# Patient Record
Sex: Male | Born: 1965 | Race: White | Hispanic: No | Marital: Married | Smoking: Current every day smoker
Health system: Southern US, Community
[De-identification: ages and names within clinical notes are randomized; demographics above are authoritative.]

## PROBLEM LIST (undated history)

## (undated) DIAGNOSIS — Z720 Tobacco use: Secondary | ICD-10-CM

## (undated) DIAGNOSIS — I10 Essential (primary) hypertension: Secondary | ICD-10-CM

## (undated) DIAGNOSIS — E785 Hyperlipidemia, unspecified: Secondary | ICD-10-CM

## (undated) HISTORY — DX: Tobacco use: Z72.0

## (undated) HISTORY — DX: Hyperlipidemia, unspecified: E78.5

---

## 2001-06-03 ENCOUNTER — Encounter: Payer: Self-pay | Admitting: Family Medicine

## 2001-06-03 ENCOUNTER — Encounter: Admission: RE | Admit: 2001-06-03 | Discharge: 2001-06-03 | Payer: Self-pay | Admitting: Family Medicine

## 2003-12-28 ENCOUNTER — Emergency Department (HOSPITAL_COMMUNITY): Admission: EM | Admit: 2003-12-28 | Discharge: 2003-12-28 | Payer: Self-pay | Admitting: *Deleted

## 2004-01-06 ENCOUNTER — Encounter: Admission: RE | Admit: 2004-01-06 | Discharge: 2004-01-06 | Payer: Self-pay | Admitting: Orthopedic Surgery

## 2004-10-11 ENCOUNTER — Ambulatory Visit: Payer: Self-pay | Admitting: Endocrinology

## 2004-10-19 ENCOUNTER — Ambulatory Visit: Payer: Self-pay

## 2006-01-07 ENCOUNTER — Ambulatory Visit: Payer: Self-pay | Admitting: Endocrinology

## 2006-01-15 ENCOUNTER — Ambulatory Visit: Payer: Self-pay | Admitting: Endocrinology

## 2006-01-21 ENCOUNTER — Ambulatory Visit (HOSPITAL_COMMUNITY): Admission: RE | Admit: 2006-01-21 | Discharge: 2006-01-21 | Payer: Self-pay | Admitting: Endocrinology

## 2007-05-15 ENCOUNTER — Encounter: Payer: Self-pay | Admitting: *Deleted

## 2007-10-14 IMAGING — US US SOFT TISSUE HEAD/NECK
1 series · 14 of 25 positions shown · non-contrast
Comparison: none

CLINICAL DATA: Right-sided thyroid nodule. 
 THYROID ULTRASOUND:
TECHNIQUE: Ultrasound examination of the thyroid gland and adjacent soft tissue structures was performed.

[Series 1: unknown · 0.09mm/px · 14 of 41 slices shown]
[im 1/41]
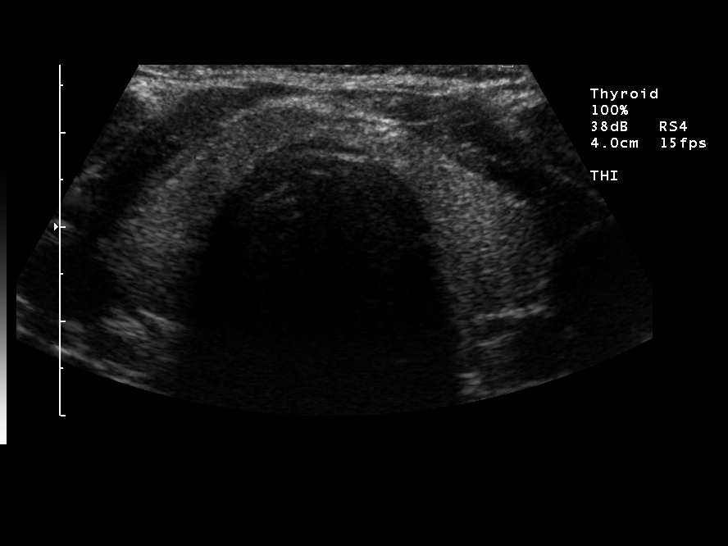
[im 4/41]
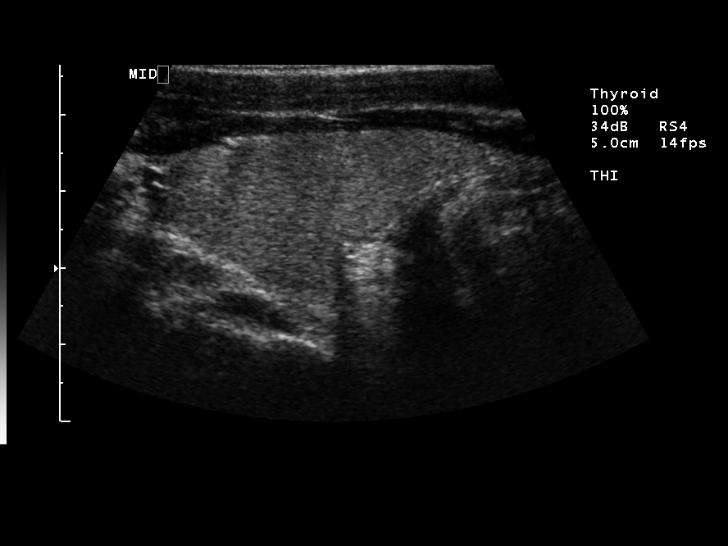
[im 7/41]
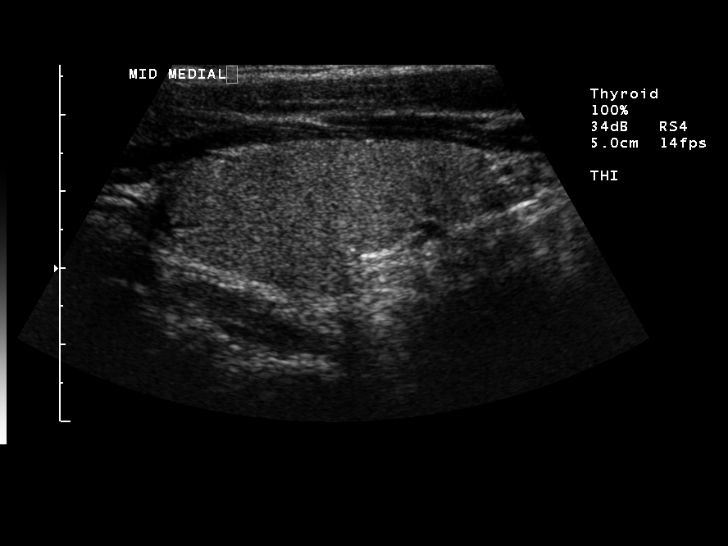
[im 11/41]
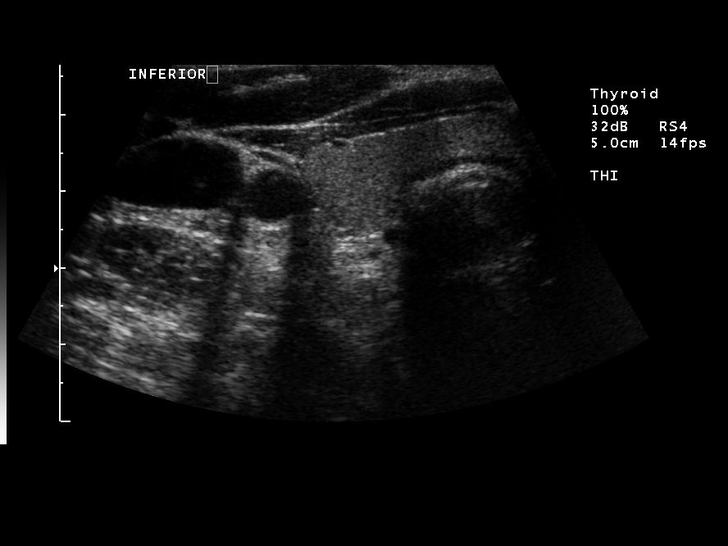
[im 14/41]
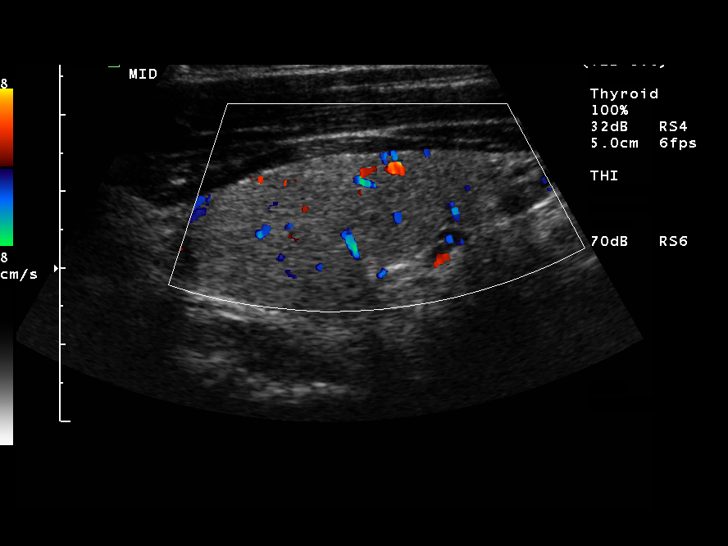
[im 16/41]
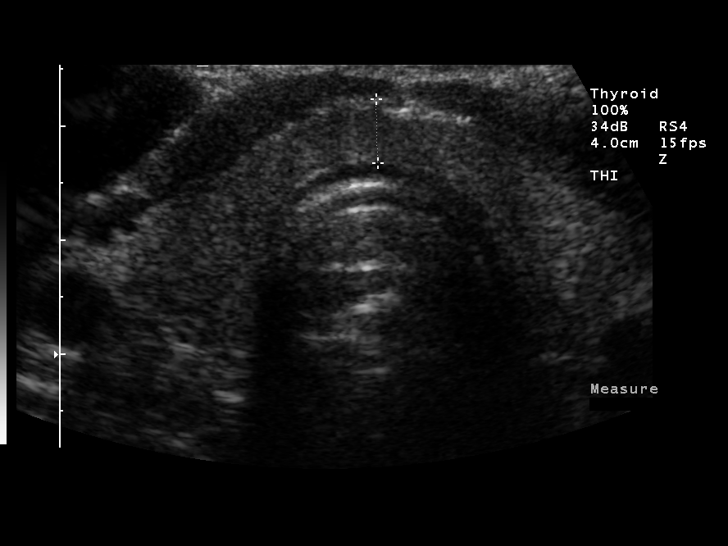
[im 19/41]
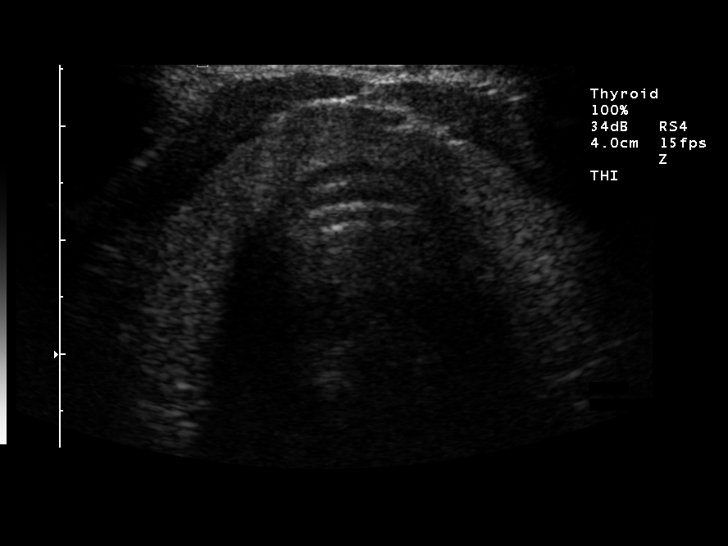
[im 22/41]
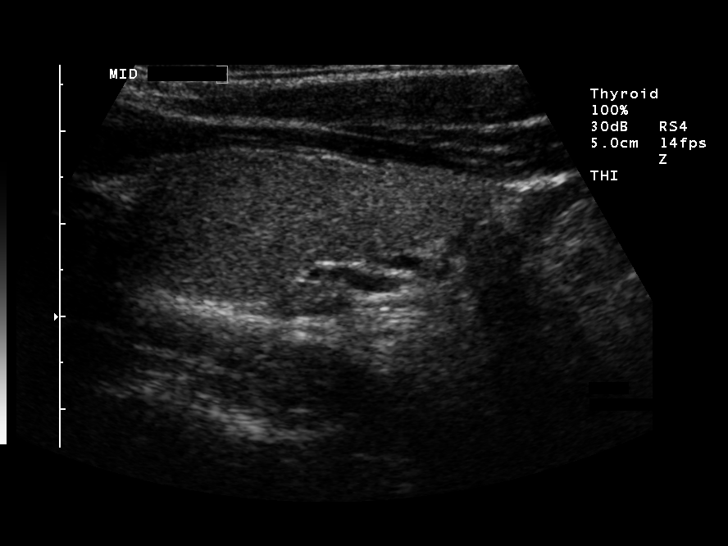
[im 26/41]
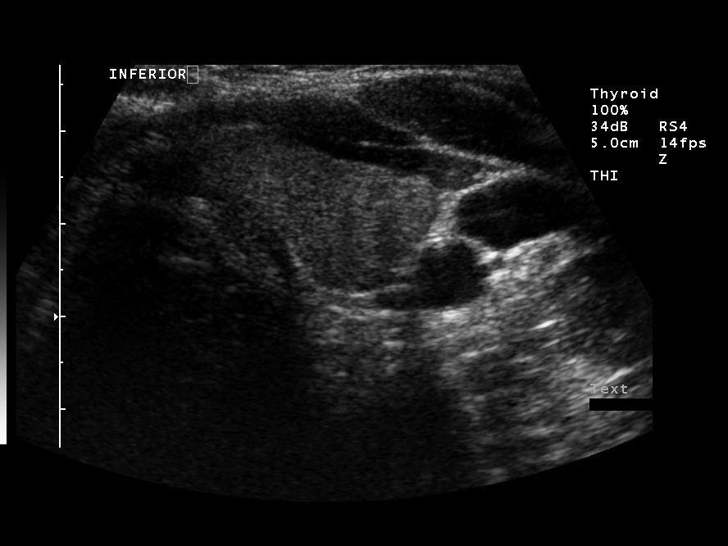
[im 27/41]
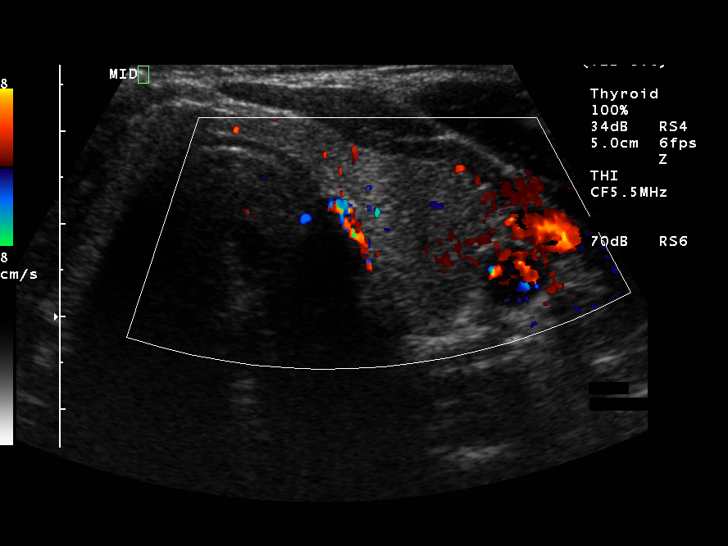
[im 31/41]
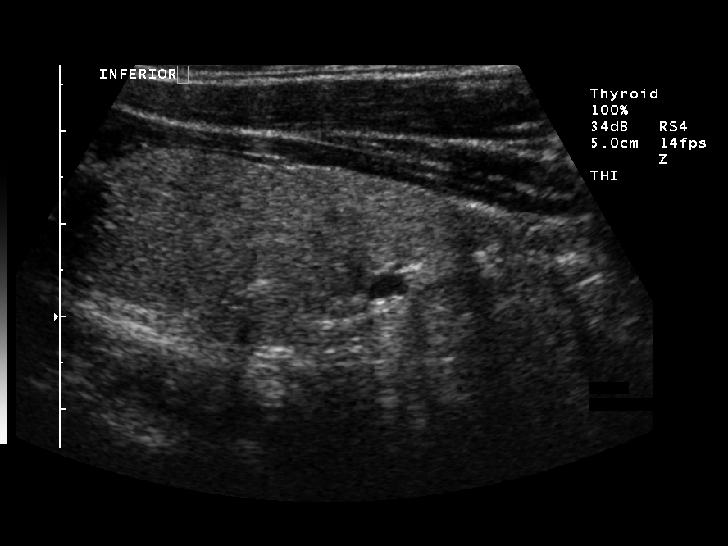
[im 34/41]
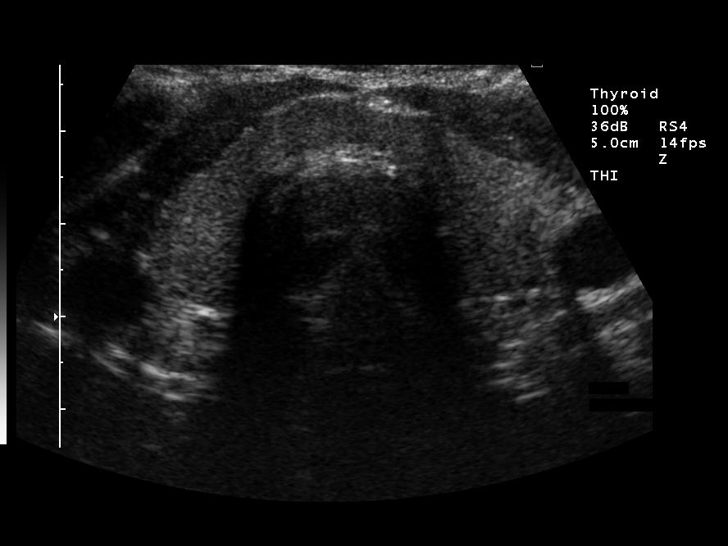
[im 37/41]
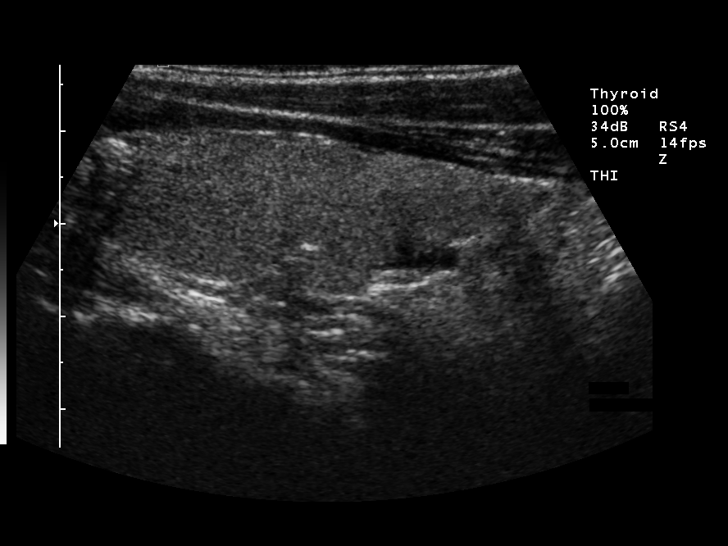
[im 41/41]
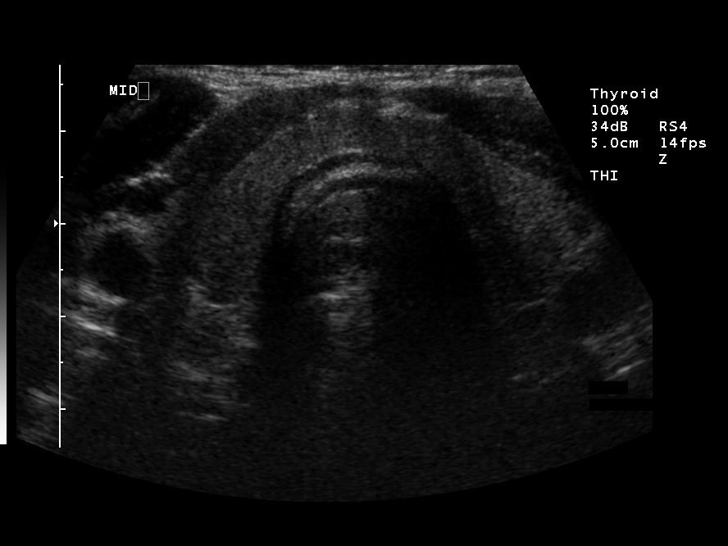

[14 of 25 positions shown; findings below may reference images not displayed]

FINDINGS: Right lobe measures 4.3 x 2.2 x 1.4 cm.  The left lobe measures 4.7 x 1.8 x 1.5 cm.  The isthmus is 6 mm.  Thyroid is homogeneous without evidence of focal lesion.  Vascularity is normal.
IMPRESSION: Normal thyroid ultrasound as described.

## 2015-04-04 ENCOUNTER — Emergency Department (HOSPITAL_COMMUNITY): Payer: PRIVATE HEALTH INSURANCE

## 2015-04-04 ENCOUNTER — Ambulatory Visit (HOSPITAL_COMMUNITY)
Admission: EM | Admit: 2015-04-04 | Discharge: 2015-04-05 | Disposition: A | Discharge status: Home / Self Care | Payer: PRIVATE HEALTH INSURANCE | Attending: Emergency Medicine | Admitting: Emergency Medicine

## 2015-04-04 ENCOUNTER — Encounter (HOSPITAL_COMMUNITY): Payer: Self-pay | Admitting: Emergency Medicine

## 2015-04-04 DIAGNOSIS — S62201B Unspecified fracture of first metacarpal bone, right hand, initial encounter for open fracture: Secondary | ICD-10-CM

## 2015-04-04 DIAGNOSIS — S62291B Other fracture of first metacarpal bone, right hand, initial encounter for open fracture: Secondary | ICD-10-CM | POA: Insufficient documentation

## 2015-04-04 DIAGNOSIS — I10 Essential (primary) hypertension: Secondary | ICD-10-CM | POA: Diagnosis not present

## 2015-04-04 DIAGNOSIS — W312XXA Contact with powered woodworking and forming machines, initial encounter: Secondary | ICD-10-CM | POA: Diagnosis not present

## 2015-04-04 DIAGNOSIS — Z23 Encounter for immunization: Secondary | ICD-10-CM | POA: Diagnosis not present

## 2015-04-04 DIAGNOSIS — F17210 Nicotine dependence, cigarettes, uncomplicated: Secondary | ICD-10-CM | POA: Diagnosis not present

## 2015-04-04 DIAGNOSIS — S56321A Laceration of extensor or abductor muscles, fascia and tendons of right thumb at forearm level, initial encounter: Secondary | ICD-10-CM | POA: Diagnosis not present

## 2015-04-04 HISTORY — DX: Essential (primary) hypertension: I10

## 2015-04-04 MED ORDER — LIDOCAINE HCL (CARDIAC) 20 MG/ML IV SOLN
INTRAVENOUS | Status: AC
  Filled 2015-04-04: qty 5

## 2015-04-04 MED ORDER — MIDAZOLAM HCL 2 MG/2ML IJ SOLN
INTRAMUSCULAR | Status: AC
  Filled 2015-04-04: qty 2

## 2015-04-04 MED ORDER — ONDANSETRON HCL 4 MG/2ML IJ SOLN
INTRAMUSCULAR | Status: AC
  Filled 2015-04-04: qty 2

## 2015-04-04 MED ORDER — FENTANYL CITRATE (PF) 250 MCG/5ML IJ SOLN
INTRAMUSCULAR | Status: AC
  Filled 2015-04-04: qty 5

## 2015-04-04 MED ORDER — HYDROMORPHONE HCL 1 MG/ML IJ SOLN
1.0000 mg | Freq: Once | INTRAMUSCULAR | Status: AC
  Administered 2015-04-04: 1 mg via INTRAVENOUS
  Filled 2015-04-04: qty 1

## 2015-04-04 MED ORDER — CEFAZOLIN SODIUM 1-5 GM-% IV SOLN
1.0000 g | Freq: Once | INTRAVENOUS | Status: AC
  Administered 2015-04-04: 1 g via INTRAVENOUS
  Filled 2015-04-04: qty 50

## 2015-04-04 MED ORDER — TETANUS-DIPHTH-ACELL PERTUSSIS 5-2.5-18.5 LF-MCG/0.5 IM SUSP
0.5000 mL | Freq: Once | INTRAMUSCULAR | Status: AC
  Administered 2015-04-04: 0.5 mL via INTRAMUSCULAR
  Filled 2015-04-04: qty 0.5

## 2015-04-04 MED ORDER — PROPOFOL 10 MG/ML IV BOLUS
INTRAVENOUS | Status: AC
  Filled 2015-04-04: qty 20

## 2015-04-04 MED ORDER — SUCCINYLCHOLINE CHLORIDE 20 MG/ML IJ SOLN
INTRAMUSCULAR | Status: AC
  Filled 2015-04-04: qty 1

## 2015-04-04 NOTE — ED Provider Notes (Signed)
CSN: [id number]     Arrival date & time 04/04/15  1545 History   First MD Initiated Contact with Patient 04/04/15 1605     Chief Complaint  Patient presents with  . Extremity Laceration     (Consider location/radiation/quality/duration/timing/severity/associated sxs/prior Treatment) HPI Comments: Patient presents today with a chief complaint of laceration to the palmar aspect of the right hand.  He reports that he cut himself with a circular saw at approximately 2:30 PM today.  He states that the saw kicked back and cut his hand.  He was given 10 mg Morphine IV by EMS en route to the ED as well as 4 mg Zofran.  He reports that his pain is controlled at this time.  Bleeding controlled with pressure.  He denies any numbness or tingling of the fingers.  ROM of the right thumb is limited.  He denies any nausea or vomiting.  He is unsure of the date of his last Tetanus.  He is left handed.    The history is provided by the patient.    Past Medical History  Diagnosis Date  . Hypertension    History reviewed. No pertinent past surgical history. No family history on file. History  Substance Use Topics  . Smoking status: Current Every Day Smoker -- 1.00 packs/day    Types: Cigarettes  . Smokeless tobacco: Not on file  . Alcohol Use: No    Review of Systems  All other systems reviewed and are negative.     Allergies  Review of patient's allergies indicates no known allergies.  Home Medications   Prior to Admission medications   Not on File   BP 141/90 mmHg  Pulse 74  Temp(Src) 98.2 F (36.8 C) (Oral)  Resp 16  SpO2 99% Physical Exam  Constitutional: He appears well-developed and well-nourished.  Cardiovascular: Normal rate, regular rhythm and normal heart sounds.   Pulses:      Radial pulses are 2+ on the right side.  Pulmonary/Chest: Effort normal and breath sounds normal.  Musculoskeletal:       Right wrist: He exhibits normal range of motion.  Limited ROM of the  right thumb with opposition, extension, and flexion.  Tendon appears to be severed.    Neurological: He is alert.  Distal sensation of all fingers intact  Skin: Skin is warm and dry.  Patient with a large 8.5 cm laceration to the palmar aspect of the right hand.  See picture below.  Good capillary refill of the right thumb.    Psychiatric: He has a normal mood and affect.  Nursing note and vitals reviewed.   ED Course  Procedures (including critical care time) Labs Review Labs Reviewed - No data to display  Imaging Review Dg Hand Complete Right  04/04/2015   CLINICAL DATA:  49 year old male with laceration at the base of the thumb  EXAM: RIGHT HAND - COMPLETE 3+ VIEW  COMPARISON:  None.  FINDINGS: There is soft tissue laceration of the proximal portion of the thumb. There is a transverse fracture through the distal portion of the first metacarpal. Note radiopaque foreign object identified.  IMPRESSION: Laceration of the soft tissues of the thumb with Fracture of the distal first metacarpal.   Electronically Signed   By: Elgie Collard M.D.   On: 04/04/2015 17:52     EKG Interpretation None         6:22 PM Discussed with Dr. Merlyn Lot with Hand Surgery.  He recommends starting the patient on Ancef.  He states that he will come see the patient in the ED. MDM   Final diagnoses:  None   Patient with a large laceration of the palmar aspect of the right hand.  Xray showing an underlying fracture of the 1st distal metacarpal.  Patient also appears to have tendon involvement.  He is neurovascularly intact.  Patient discussed with Dr. Merlyn LotKuzma with Hand Surgery who brought the patient to the OR for surgical repair.      Santiago GladHeather Revel Stellmach, PA-C 04/05/15 [id number]  Gerhard Munchobert Lockwood, MD 04/05/15 [phone]

## 2015-04-04 NOTE — ED Notes (Signed)
Transported patient to short stay. Patient wife at bedside has all of patient belongings.

## 2015-04-04 NOTE — Anesthesia Preprocedure Evaluation (Addendum)
Anesthesia Evaluation  Patient identified by MRN, date of birth, ID band Patient awake    Reviewed: Allergy & Precautions, NPO status , Patient's Chart, lab work & pertinent test results  History of Anesthesia Complications Negative for: history of anesthetic complications  Airway Mallampati: II  TM Distance: >3 FB Neck ROM: Full    Dental  (+) Teeth Intact, Dental Advisory Given,    Pulmonary Current Smoker,    Pulmonary exam normal       Cardiovascular hypertension, negative cardio ROS Normal cardiovascular exam    Neuro/Psych negative neurological ROS  negative psych ROS   GI/Hepatic negative GI ROS, Neg liver ROS,   Endo/Other  negative endocrine ROS  Renal/GU negative Renal ROS  negative genitourinary   Musculoskeletal negative musculoskeletal ROS (+)   Abdominal   Peds negative pediatric ROS (+)  Hematology negative hematology ROS (+)   Anesthesia Other Findings   Reproductive/Obstetrics negative OB ROS                        Anesthesia Physical Anesthesia Plan  ASA: II and emergent  Anesthesia Plan: General   Post-op Pain Management:    Induction: Intravenous  Airway Management Planned: Oral ETT  Additional Equipment:   Intra-op Plan:   Post-operative Plan: Extubation in OR  Informed Consent: I have reviewed the patients History and Physical, chart, labs and discussed the procedure including the risks, benefits and alternatives for the proposed anesthesia with the patient or authorized representative who has indicated his/her understanding and acceptance.   Dental advisory given  Plan Discussed with: CRNA, Anesthesiologist and Surgeon  Anesthesia Plan Comments:       Anesthesia Quick Evaluation

## 2015-04-04 NOTE — ED Notes (Signed)
OR called and stated to transport patient to short stay.

## 2015-04-04 NOTE — ED Notes (Signed)
Patient advised spoke with PA, PA states MD has 3 cases before him so it may be a little while.

## 2015-04-04 NOTE — ED Notes (Signed)
Patient does not have rings on fingers.

## 2015-04-04 NOTE — ED Notes (Signed)
Patient comes from Home. States he was at home using a circular saw and it "kicked back" and cut his Right hand from the wrist to thumb. Sensation is tack, Patient able to move all extremities. Patient radial pulse noted. Patient complians of pain 4/10. Patient has recived 10mg  of Morphine. 4 Zofran and 700 Normal Saline. Patient alert & O X4 Patient denies any LOC.

## 2015-04-05 ENCOUNTER — Emergency Department (HOSPITAL_COMMUNITY): Payer: PRIVATE HEALTH INSURANCE | Admitting: Anesthesiology

## 2015-04-05 ENCOUNTER — Encounter (HOSPITAL_COMMUNITY)
Admission: EM | Disposition: A | Discharge status: Home / Self Care | Payer: Self-pay | Source: Home / Self Care | Attending: Emergency Medicine

## 2015-04-05 HISTORY — PX: I & D EXTREMITY: SHX5045

## 2015-04-05 SURGERY — IRRIGATION AND DEBRIDEMENT EXTREMITY
Anesthesia: General | Site: Hand | Laterality: Right

## 2015-04-05 MED ORDER — PROMETHAZINE HCL 25 MG/ML IJ SOLN: 6.2500 mg | INTRAMUSCULAR | Status: DC | PRN

## 2015-04-05 MED ORDER — FENTANYL CITRATE (PF) 100 MCG/2ML IJ SOLN
INTRAMUSCULAR | Status: DC | PRN
  Administered 2015-04-05: 100 ug via INTRAVENOUS
  Administered 2015-04-05: 150 ug via INTRAVENOUS

## 2015-04-05 MED ORDER — BUPIVACAINE HCL (PF) 0.25 % IJ SOLN
INTRAMUSCULAR | Status: AC
  Filled 2015-04-05: qty 30

## 2015-04-05 MED ORDER — ROCURONIUM BROMIDE 100 MG/10ML IV SOLN
INTRAVENOUS | Status: DC | PRN
  Administered 2015-04-05: 20 mg via INTRAVENOUS

## 2015-04-05 MED ORDER — CEFAZOLIN SODIUM-DEXTROSE 2-3 GM-% IV SOLR
INTRAVENOUS | Status: AC
  Administered 2015-04-05: 2 g via INTRAVENOUS
  Filled 2015-04-05: qty 50

## 2015-04-05 MED ORDER — GLYCOPYRROLATE 0.2 MG/ML IJ SOLN
INTRAMUSCULAR | Status: DC | PRN
  Administered 2015-04-05: 0.4 mg via INTRAVENOUS
  Administered 2015-04-05: 0.2 mg via INTRAVENOUS

## 2015-04-05 MED ORDER — HYDROCODONE-ACETAMINOPHEN 5-325 MG PO TABS: ORAL_TABLET | ORAL | Status: DC

## 2015-04-05 MED ORDER — LACTATED RINGERS IV SOLN
INTRAVENOUS | Status: DC | PRN
  Administered 2015-04-05 (×2): via INTRAVENOUS

## 2015-04-05 MED ORDER — ONDANSETRON HCL 4 MG/2ML IJ SOLN
INTRAMUSCULAR | Status: DC | PRN
  Administered 2015-04-05: 4 mg via INTRAVENOUS

## 2015-04-05 MED ORDER — PHENYLEPHRINE HCL 10 MG/ML IJ SOLN
INTRAMUSCULAR | Status: DC | PRN
  Administered 2015-04-05: 120 ug via INTRAVENOUS
  Administered 2015-04-05 (×3): 80 ug via INTRAVENOUS

## 2015-04-05 MED ORDER — SUCCINYLCHOLINE CHLORIDE 20 MG/ML IJ SOLN
INTRAMUSCULAR | Status: DC | PRN
  Administered 2015-04-05: 120 mg via INTRAVENOUS

## 2015-04-05 MED ORDER — MIDAZOLAM HCL 5 MG/5ML IJ SOLN
INTRAMUSCULAR | Status: DC | PRN
  Administered 2015-04-05: 2 mg via INTRAVENOUS

## 2015-04-05 MED ORDER — BUPIVACAINE HCL (PF) 0.25 % IJ SOLN
INTRAMUSCULAR | Status: DC | PRN
  Administered 2015-04-05: 10 mL

## 2015-04-05 MED ORDER — ROCURONIUM BROMIDE 50 MG/5ML IV SOLN
INTRAVENOUS | Status: AC
  Filled 2015-04-05: qty 1

## 2015-04-05 MED ORDER — HYDROMORPHONE HCL 1 MG/ML IJ SOLN: 0.2500 mg | INTRAMUSCULAR | Status: DC | PRN

## 2015-04-05 MED ORDER — SODIUM CHLORIDE 0.9 % IR SOLN
Status: DC | PRN
  Administered 2015-04-05: 2000 mL

## 2015-04-05 MED ORDER — NEOSTIGMINE METHYLSULFATE 10 MG/10ML IV SOLN
INTRAVENOUS | Status: DC | PRN
  Administered 2015-04-05: 4 mg via INTRAVENOUS

## 2015-04-05 MED ORDER — PHENYLEPHRINE 40 MCG/ML (10ML) SYRINGE FOR IV PUSH (FOR BLOOD PRESSURE SUPPORT)
PREFILLED_SYRINGE | INTRAVENOUS | Status: AC
  Filled 2015-04-05: qty 10

## 2015-04-05 MED ORDER — SULFAMETHOXAZOLE-TRIMETHOPRIM 800-160 MG PO TABS: 1.0000 | ORAL_TABLET | Freq: Two times a day (BID) | ORAL | Status: DC

## 2015-04-05 MED ORDER — PROPOFOL 10 MG/ML IV BOLUS
INTRAVENOUS | Status: AC
  Filled 2015-04-05: qty 20

## 2015-04-05 MED ORDER — LIDOCAINE HCL (CARDIAC) 20 MG/ML IV SOLN
INTRAVENOUS | Status: DC | PRN
  Administered 2015-04-05: 100 mg via INTRAVENOUS

## 2015-04-05 MED ORDER — PROPOFOL 10 MG/ML IV BOLUS
INTRAVENOUS | Status: DC | PRN
  Administered 2015-04-05: 100 mg via INTRAVENOUS
  Administered 2015-04-05: 200 mg via INTRAVENOUS

## 2015-04-05 SURGICAL SUPPLY — 38 items
BANDAGE ELASTIC 3 VELCRO ST LF (GAUZE/BANDAGES/DRESSINGS) ×3 IMPLANT
BNDG CMPR 9X4 STRL LF SNTH (GAUZE/BANDAGES/DRESSINGS)
BNDG CONFORM 2 STRL LF (GAUZE/BANDAGES/DRESSINGS) IMPLANT
BNDG ESMARK 4X9 LF (GAUZE/BANDAGES/DRESSINGS) IMPLANT
BNDG GAUZE ELAST 4 BULKY (GAUZE/BANDAGES/DRESSINGS) ×3 IMPLANT
CORDS BIPOLAR (ELECTRODE) ×3 IMPLANT
COVER SURGICAL LIGHT HANDLE (MISCELLANEOUS) ×3 IMPLANT
DRSG ADAPTIC 3X8 NADH LF (GAUZE/BANDAGES/DRESSINGS) IMPLANT
DRSG EMULSION OIL 3X3 NADH (GAUZE/BANDAGES/DRESSINGS) ×3 IMPLANT
GAUZE SPONGE 4X4 12PLY STRL (GAUZE/BANDAGES/DRESSINGS) ×3 IMPLANT
GAUZE XEROFORM 1X8 LF (GAUZE/BANDAGES/DRESSINGS) ×3 IMPLANT
GLOVE BIO SURGEON STRL SZ7.5 (GLOVE) ×3 IMPLANT
GLOVE BIOGEL PI IND STRL 8 (GLOVE) ×1 IMPLANT
GLOVE BIOGEL PI INDICATOR 8 (GLOVE) ×2
GOWN STRL REUS W/ TWL LRG LVL3 (GOWN DISPOSABLE) ×1 IMPLANT
GOWN STRL REUS W/TWL LRG LVL3 (GOWN DISPOSABLE) ×3
KIT BASIN OR (CUSTOM PROCEDURE TRAY) ×3 IMPLANT
KIT ROOM TURNOVER OR (KITS) ×3 IMPLANT
NDL HYPO 25X1 1.5 SAFETY (NEEDLE) IMPLANT
NEEDLE HYPO 25X1 1.5 SAFETY (NEEDLE) ×3 IMPLANT
NS IRRIG 1000ML POUR BTL (IV SOLUTION) ×3 IMPLANT
PACK ORTHO EXTREMITY (CUSTOM PROCEDURE TRAY) ×3 IMPLANT
PAD ARMBOARD 7.5X6 YLW CONV (MISCELLANEOUS) ×6 IMPLANT
PAD CAST 4YDX4 CTTN HI CHSV (CAST SUPPLIES) IMPLANT
PADDING CAST COTTON 4X4 STRL (CAST SUPPLIES) ×3
SCRUB BETADINE 4OZ XXX (MISCELLANEOUS) ×3 IMPLANT
SET CYSTO W/LG BORE CLAMP LF (SET/KITS/TRAYS/PACK) ×3 IMPLANT
SOLUTION BETADINE 4OZ (MISCELLANEOUS) ×3 IMPLANT
SPONGE GAUZE 4X4 12PLY STER LF (GAUZE/BANDAGES/DRESSINGS) ×2 IMPLANT
SPONGE LAP 18X18 X RAY DECT (DISPOSABLE) ×3 IMPLANT
SUT ETHIBOND 3-0 V-5 (SUTURE) ×2 IMPLANT
SUT ETHILON 4 0 PS 2 18 (SUTURE) ×3 IMPLANT
SUT VIC AB 3-0 FS2 27 (SUTURE) ×2 IMPLANT
TOWEL OR 17X24 6PK STRL BLUE (TOWEL DISPOSABLE) ×3 IMPLANT
TOWEL OR 17X26 10 PK STRL BLUE (TOWEL DISPOSABLE) ×3 IMPLANT
TUBE CONNECTING 12'X1/4 (SUCTIONS) ×1
TUBE CONNECTING 12X1/4 (SUCTIONS) ×2 IMPLANT
YANKAUER SUCT BULB TIP NO VENT (SUCTIONS) ×3 IMPLANT

## 2015-04-05 NOTE — H&P (Addendum)
Late entry.  Mark Downs is an 49 y.o. male.   Chief Complaint: right thenar eminence saw injury HPI: 49 yo lhd male states he lacerated right hand while using circular saw to cut hardwood floor.  [facility] fracture of right thumb metacarpal.  Reports no previous injury to hand and no other injury at this time.  Past Medical History  Diagnosis Date  . Hypertension     History reviewed. No pertinent past surgical history.  No family history on file. Social History:  reports that he has been smoking Cigarettes.  He has been smoking about 1.00 pack per day. He does not have any smokeless tobacco history on file. He reports that he does not drink alcohol or use illicit drugs.  Allergies: No Known Allergies  No prescriptions prior to admission    No results found for this or any previous visit (from the past 48 hour(s)).  Dg Hand Complete Right  04/04/2015   CLINICAL DATA:  49 year old male with laceration at the base of the thumb  EXAM: RIGHT HAND - COMPLETE 3+ VIEW  COMPARISON:  None.  FINDINGS: There is soft tissue laceration of the proximal portion of the thumb. There is a transverse fracture through the distal portion of the first metacarpal. Note radiopaque foreign object identified.  IMPRESSION: Laceration of the soft tissues of the thumb with Fracture of the distal first metacarpal.   Electronically Signed   By: Elgie CollardArash  Radparvar M.D.   On: 04/04/2015 17:52     A comprehensive review of systems was negative.  Blood pressure 121/83, pulse 64, temperature 96.9 F (36.1 C), temperature source Oral, resp. rate 16, SpO2 95 %.  General appearance: alert, cooperative and appears stated age Head: Normocephalic, without obvious abnormality, atraumatic Neck: supple, symmetrical, trachea midline Resp: clear to auscultation bilaterally Cardio: regular rate and rhythm GI: non tender Extremities: intact sensation and capillary refill all digits.  +epl/fpl/io on  left.  +fpl/io on right.  difficultly with extnesion  of thumb.  unable to abduct thumb.  laceration at base of thenar eminence. Pulses: 2+ and symmetric Skin: Skin color, texture, turgor normal. No rashes or lesions Neurologic: Grossly normal Incision/Wound: As above  Assessment/Plan Right thenar eminence laceration with metacarpal fracture.  Recommend OR for exploration with repair tendon/artery/nerve as necessary and possible pinning of fracture if unstable.  Risks, benefits, and alternatives of surgery were discussed and the patient agrees with the plan of care.   [name] 04/05/2015, 4:24 AM

## 2015-04-05 NOTE — Transfer of Care (Signed)
Immediate Anesthesia Transfer of Care Note  Patient: Mark Downs  Procedure(s) Performed: Procedure(s): IRRIGATION AND DEBRIDEMENT EXTREMITY (Right)  Patient Location: PACU  Anesthesia Type:General  Level of Consciousness: awake  Airway & Oxygen Therapy: Patient Spontanous Breathing and Patient connected to nasal cannula oxygen  Post-op Assessment: Report given to RN and Post -op Vital signs reviewed and stable  Post vital signs: Reviewed and stable  Last Vitals:  Filed Vitals:   04/04/15 2309  BP:   Pulse:   Temp:   Resp: 16    Complications: No apparent anesthesia complications

## 2015-04-05 NOTE — Discharge Instructions (Signed)

## 2015-04-05 NOTE — Op Note (Signed)
[id number] 

## 2015-04-05 NOTE — Brief Op Note (Signed)
04/04/2015 - 04/05/2015  4:27 AM  PATIENT:  Mark Downs  49 y.o. male  PRE-OPERATIVE DIAGNOSIS:  Saw Injury  POST-OPERATIVE DIAGNOSIS:  Saw Injury  PROCEDURE:  Procedure(s): IRRIGATION AND DEBRIDEMENT EXTREMITY WITH TENDON REPAIR (Right)  SURGEON:  Surgeon(s) and Role:    * Betha LoaKevin Torrion Witter, MD - Primary  PHYSICIAN ASSISTANT:   ASSISTANTS: none   ANESTHESIA:   general  EBL:  Total I/O In: 1750 [I.V.:1750] Out: 15 [Blood:15]  BLOOD ADMINISTERED:none  DRAINS: none   LOCAL MEDICATIONS USED:  MARCAINE     SPECIMEN:  No Specimen  DISPOSITION OF SPECIMEN:  N/A  COUNTS:  YES  TOURNIQUET:   Total Tourniquet Time Documented: Upper Arm (Right) - 42 minutes Total: Upper Arm (Right) - 42 minutes   DICTATION: .Other Dictation: Dictation Number [id number]  PLAN OF CARE: Discharge to home after PACU  PATIENT DISPOSITION:  PACU - hemodynamically stable.

## 2015-04-05 NOTE — Anesthesia Procedure Notes (Signed)
Procedure Name: Intubation Date/Time: 04/05/2015 12:20 AM Performed by: Ryleigh Esqueda S Pre-anesthesia Checklist: Patient identified, Timeout performed, Emergency Drugs available, Suction available and Patient being monitored Patient Re-evaluated:Patient Re-evaluated prior to inductionOxygen Delivery Method: Circle system utilized Preoxygenation: Pre-oxygenation with 100% oxygen Intubation Type: IV induction Ventilation: Mask ventilation without difficulty Laryngoscope Size: Mac and 4 Grade View: Grade I Tube type: Oral Tube size: 7.5 mm Number of attempts: 1 Airway Equipment and Method: Stylet Placement Confirmation: positive ETCO2,  ETT inserted through vocal cords under direct vision and breath sounds checked- equal and bilateral Secured at: 21 cm Tube secured with: Tape Dental Injury: Teeth and Oropharynx as per pre-operative assessment

## 2015-04-05 NOTE — Op Note (Signed)
Intra-operative fluoroscopic images in the AP, lateral, and oblique views were taken and evaluated by myself.  Reduction and hardware placement were confirmed.  There was no intraarticular penetration of permanent hardware.  

## 2015-04-05 NOTE — Anesthesia Postprocedure Evaluation (Signed)
Anesthesia Post Note  Patient: Mark NorfolkDavid Downs  Procedure(s) Performed: Procedure(s) (LRB): IRRIGATION AND DEBRIDEMENT EXTREMITY WITH TENDON REPAIR (Right)  Anesthesia type: general  Patient location: PACU  Post pain: Pain level controlled  Post assessment: Patient's Cardiovascular Status Stable  Last Vitals:  Filed Vitals:   04/05/15 0245  BP: 121/83  Pulse:   Temp: 36.1 C  Resp:     Post vital signs: Reviewed and stable  Level of consciousness: sedated  Complications: No apparent anesthesia complications

## 2015-04-06 ENCOUNTER — Encounter (HOSPITAL_COMMUNITY): Payer: Self-pay | Admitting: Orthopedic Surgery

## 2015-04-06 NOTE — Op Note (Signed)
NAME:  Mark Downs, Mark Downs              ACCOUNT NO.:  [id number]  MEDICAL RECORD NO.Mark Downs:  [id number]  LOCATION:  [facility]                         FACILITY:  [facility]  PHYSICIAN:  Mark LoaKevin Anique Beckley, MD        DATE OF BIRTH:  01/26/1966  DATE OF PROCEDURE:  04/05/2015 DATE OF DISCHARGE:  04/05/2015                              OPERATIVE REPORT   PREOPERATIVE DIAGNOSIS:  Right thenar eminence saw injury with open thumb metacarpal fracture.  POSTOPERATIVE DIAGNOSIS:  Right thenar eminence saw injury with open thumb metacarpal fracture with extensor pollicis brevis laceration, partial extensor pollicis longus laceration, and thenar musculature laceration.  PROCEDURE:   1. Irrigation and debridement of wound with repair of abductor pollicis brevis and opponens pollicis muscles 2. Repair of extensor pollicis longus 3. Repair of extensor pollicis brevis tendons. 4. Irrigation and debridement open metacarpal fracture  SURGEON:  Mark LoaKevin Georgie Haque, MD  ASSISTANT:  None.  ANESTHESIA:  General.  IV FLUIDS:  Per anesthesia flow sheet.  ESTIMATED BLOOD LOSS:  Minimal.  COMPLICATIONS:  None.  SPECIMENS:  None.  TOURNIQUET TIME:  42 minutes.  DISPOSITION:  Stable to PACU.  INDICATIONS:  Mark Downs is a 49 year old left-hand dominant male who was using a circular saw last evening when he lacerated the right hand at the base of his thumb.  [facility] where radiographs were taken revealing a right thumb metacarpal fracture.  I was consulted for management of the injury.  On examination, he had intact sensation and capillary refill on the fingertips.  He could flex the IP joint and thumb, had weak extension. He was unable to abduct the thumb away from the palm.  There was a laceration at the base of the thenar eminence.  I recommended operative exploration with repair of tendon, artery, and nerve as necessary and possible pinning of the fracture if necessary.  Risks,  benefits, and alternatives of surgery were discussed including risk of blood loss; infection; damage to nerves, vessels, tendons, ligaments, bone; failure of surgery; need for additional surgery; complications with wound healing, continued pain, need for further surgeries.  He voiced understanding of these risks and elected to proceed.  OPERATIVE COURSE:  After being identified preoperatively by myself, the patient and I agreed upon procedure and site of procedure.  Surgical site was marked.  The risks, benefits, and alternatives of surgery were reviewed and he wished to proceed.  Surgical consent had been signed. He was transported to the operating room and placed on the operating room table in a supine position with the right upper extremity on arm board.  General anesthesia was induced by anesthesiologist.  He had been given IV Ancef as preoperative antibiotic prophylaxis.  Right upper extremity was prepped and draped in normal sterile orthopedic fashion. Surgical pause was performed between surgeons, anesthesia, and operating room staff, and all were in agreement as to the patient, procedure, and site of procedure.  Tourniquet at the proximal aspect of the extremity was inflated to 250 mmHg after exsanguination of the limb with an Esmarch bandage.  The wound was explored.  There was laceration of the thenar musculature including the opponens pollicis and abductor  pollicis brevis.  The FPL tendon was able to be visualized and was intact.  The EPB tendon was 100% lacerated.  There was partial laceration to the EPL tendon.  The fracture in the thumb metacarpal at the base of the head was stable.  I did not go all the way through the bone.  This appeared to be where the saw took a kerf of bone out.  The wound and open fracture were copiously irrigated with sterile saline. There was no gross contamination.  The thenar musculature including both abductor pollicis brevis and opponens pollicis  were repaired with a 3-0 Vicryl suture in a figure-of-eight fashion.  The EPL tendon was repaired with a 3-0 Ethibond suture in a figure-of-eight fashion. The EPB tendon was repaired with the 3-0 Ethibond suture in a figure-of- eight fashion.  This provided good apposition of the tendon ends.  The skin was closed with 4-0 nylon in a horizontal mattress fashion.  The wound was injected with 10 mL of 0.25% plain Marcaine to aid in postoperative analgesia.  It was dressed with sterile Xeroform, 4x4s, and wrapped with a Kerlix bandage.  A thumb spica splint was placed and wrapped with Kerlix and Ace bandage.  Tourniquet was deflated at 42 minutes.  Fingertips were pink with brisk capillary refill after deflation of the tourniquet.  Operative drapes were broken down and the patient was awoken from anesthesia safely.  He was transferred back to the stretcher and taken to PACU in stable condition.  I will see him back in the office in 1 week for postoperative followup.  I will give him Norco 5/325, one to two p.o. q.6 hours p.r.n. pain, dispensed #30, and Bactrim DS 1 p.o. b.i.d. x7 days.     Mark Loa, MD     KK/MEDQ  D:  04/05/2015  T:  04/06/2015  Job:  [id number]

## 2015-05-02 ENCOUNTER — Other Ambulatory Visit: Payer: Self-pay | Admitting: Orthopedic Surgery

## 2015-05-12 ENCOUNTER — Encounter (HOSPITAL_BASED_OUTPATIENT_CLINIC_OR_DEPARTMENT_OTHER): Payer: Self-pay | Admitting: *Deleted

## 2015-05-17 ENCOUNTER — Encounter (HOSPITAL_BASED_OUTPATIENT_CLINIC_OR_DEPARTMENT_OTHER): Payer: Self-pay | Admitting: Certified Registered"

## 2015-05-17 ENCOUNTER — Ambulatory Visit (HOSPITAL_BASED_OUTPATIENT_CLINIC_OR_DEPARTMENT_OTHER): Payer: PRIVATE HEALTH INSURANCE | Admitting: Anesthesiology

## 2015-05-17 ENCOUNTER — Encounter (HOSPITAL_BASED_OUTPATIENT_CLINIC_OR_DEPARTMENT_OTHER)
Admission: RE | Disposition: A | Discharge status: Home / Self Care | Payer: Self-pay | Source: Ambulatory Visit | Attending: Orthopedic Surgery

## 2015-05-17 ENCOUNTER — Ambulatory Visit (HOSPITAL_BASED_OUTPATIENT_CLINIC_OR_DEPARTMENT_OTHER)
Admission: RE | Admit: 2015-05-17 | Discharge: 2015-05-17 | Disposition: A | Discharge status: Home / Self Care | Payer: PRIVATE HEALTH INSURANCE | Source: Ambulatory Visit | Attending: Orthopedic Surgery | Admitting: Orthopedic Surgery

## 2015-05-17 DIAGNOSIS — S62201A Unspecified fracture of first metacarpal bone, right hand, initial encounter for closed fracture: Secondary | ICD-10-CM | POA: Insufficient documentation

## 2015-05-17 DIAGNOSIS — W312XXA Contact with powered woodworking and forming machines, initial encounter: Secondary | ICD-10-CM | POA: Insufficient documentation

## 2015-05-17 DIAGNOSIS — F17210 Nicotine dependence, cigarettes, uncomplicated: Secondary | ICD-10-CM | POA: Insufficient documentation

## 2015-05-17 HISTORY — PX: OPEN REDUCTION INTERNAL FIXATION (ORIF) FINGER WITH RADIAL BONE GRAFT: SHX5666

## 2015-05-17 LAB — POCT HEMOGLOBIN-HEMACUE: Hemoglobin: 16.1 g/dL (ref 13.0–17.0)

## 2015-05-17 SURGERY — OPEN REDUCTION INTERNAL FIXATION (ORIF) FINGER WITH RADIAL BONE GRAFT
Anesthesia: Regional | Site: Thumb | Laterality: Right

## 2015-05-17 MED ORDER — FENTANYL CITRATE (PF) 100 MCG/2ML IJ SOLN
50.0000 ug | INTRAMUSCULAR | Status: DC | PRN
  Administered 2015-05-17: 100 ug via INTRAVENOUS

## 2015-05-17 MED ORDER — LIDOCAINE HCL (CARDIAC) 20 MG/ML IV SOLN
INTRAVENOUS | Status: DC | PRN
  Administered 2015-05-17: 30 mg via INTRAVENOUS

## 2015-05-17 MED ORDER — CHLORHEXIDINE GLUCONATE 4 % EX LIQD: 60.0000 mL | Freq: Once | CUTANEOUS | Status: DC

## 2015-05-17 MED ORDER — OXYCODONE HCL 5 MG/5ML PO SOLN: 5.0000 mg | Freq: Once | ORAL | Status: DC | PRN

## 2015-05-17 MED ORDER — SCOPOLAMINE 1 MG/3DAYS TD PT72: 1.0000 | MEDICATED_PATCH | Freq: Once | TRANSDERMAL | Status: DC | PRN

## 2015-05-17 MED ORDER — MEPERIDINE HCL 25 MG/ML IJ SOLN: 6.2500 mg | INTRAMUSCULAR | Status: DC | PRN

## 2015-05-17 MED ORDER — MIDAZOLAM HCL 2 MG/2ML IJ SOLN
1.0000 mg | INTRAMUSCULAR | Status: DC | PRN
Start: 2015-05-17 — End: 2015-05-17
  Administered 2015-05-17 (×2): 2 mg via INTRAVENOUS

## 2015-05-17 MED ORDER — MIDAZOLAM HCL 2 MG/2ML IJ SOLN
INTRAMUSCULAR | Status: AC
  Filled 2015-05-17: qty 2

## 2015-05-17 MED ORDER — CEFAZOLIN SODIUM-DEXTROSE 2-3 GM-% IV SOLR
2.0000 g | INTRAVENOUS | Status: AC
  Administered 2015-05-17: 2 g via INTRAVENOUS

## 2015-05-17 MED ORDER — FENTANYL CITRATE (PF) 100 MCG/2ML IJ SOLN
INTRAMUSCULAR | Status: AC
  Filled 2015-05-17: qty 6

## 2015-05-17 MED ORDER — BUPIVACAINE-EPINEPHRINE (PF) 0.5% -1:200000 IJ SOLN
INTRAMUSCULAR | Status: DC | PRN
  Administered 2015-05-17: 25 mL via PERINEURAL

## 2015-05-17 MED ORDER — OXYCODONE HCL 5 MG PO TABS: 5.0000 mg | ORAL_TABLET | Freq: Once | ORAL | Status: DC | PRN

## 2015-05-17 MED ORDER — DEXAMETHASONE SODIUM PHOSPHATE 10 MG/ML IJ SOLN
INTRAMUSCULAR | Status: DC | PRN
  Administered 2015-05-17: 10 mg via INTRAVENOUS

## 2015-05-17 MED ORDER — GLYCOPYRROLATE 0.2 MG/ML IJ SOLN: 0.2000 mg | Freq: Once | INTRAMUSCULAR | Status: DC | PRN

## 2015-05-17 MED ORDER — FENTANYL CITRATE (PF) 100 MCG/2ML IJ SOLN
INTRAMUSCULAR | Status: AC
  Filled 2015-05-17: qty 2

## 2015-05-17 MED ORDER — HYDROMORPHONE HCL 1 MG/ML IJ SOLN: 0.2500 mg | INTRAMUSCULAR | Status: DC | PRN

## 2015-05-17 MED ORDER — HYDROCODONE-ACETAMINOPHEN 5-325 MG PO TABS: ORAL_TABLET | ORAL | Status: DC

## 2015-05-17 MED ORDER — CEFAZOLIN SODIUM-DEXTROSE 2-3 GM-% IV SOLR
INTRAVENOUS | Status: AC
  Filled 2015-05-17: qty 50

## 2015-05-17 MED ORDER — ONDANSETRON HCL 4 MG/2ML IJ SOLN
INTRAMUSCULAR | Status: DC | PRN
  Administered 2015-05-17: 4 mg via INTRAVENOUS

## 2015-05-17 MED ORDER — LACTATED RINGERS IV SOLN
INTRAVENOUS | Status: DC
  Administered 2015-05-17 (×2): via INTRAVENOUS

## 2015-05-17 MED ORDER — PROPOFOL 10 MG/ML IV BOLUS
INTRAVENOUS | Status: DC | PRN
  Administered 2015-05-17: 200 mg via INTRAVENOUS

## 2015-05-17 SURGICAL SUPPLY — 65 items
BANDAGE ELASTIC 3 VELCRO ST LF (GAUZE/BANDAGES/DRESSINGS) ×3 IMPLANT
BLADE MINI RND TIP GREEN BEAV (BLADE) IMPLANT
BLADE SURG 15 STRL LF DISP TIS (BLADE) ×4 IMPLANT
BLADE SURG 15 STRL SS (BLADE) ×8
BNDG CMPR 9X4 STRL LF SNTH (GAUZE/BANDAGES/DRESSINGS) ×2
BNDG CMPR MD 5X2 ELC HKLP STRL (GAUZE/BANDAGES/DRESSINGS)
BNDG ELASTIC 2 VLCR STRL LF (GAUZE/BANDAGES/DRESSINGS) IMPLANT
BNDG ESMARK 4X9 LF (GAUZE/BANDAGES/DRESSINGS) ×4 IMPLANT
BNDG GAUZE ELAST 4 BULKY (GAUZE/BANDAGES/DRESSINGS) ×4 IMPLANT
CHLORAPREP W/TINT 26ML (MISCELLANEOUS) ×4 IMPLANT
CORDS BIPOLAR (ELECTRODE) ×4 IMPLANT
COVER BACK TABLE 60X90IN (DRAPES) ×4 IMPLANT
COVER MAYO STAND STRL (DRAPES) ×4 IMPLANT
CUFF TOURNIQUET SINGLE 18IN (TOURNIQUET CUFF) ×4 IMPLANT
CUFF TOURNIQUET SINGLE 24IN (TOURNIQUET CUFF) IMPLANT
DRAPE EXTREMITY T 121X128X90 (DRAPE) ×4 IMPLANT
DRAPE OEC MINIVIEW 54X84 (DRAPES) ×4 IMPLANT
DRAPE SURG 17X23 STRL (DRAPES) ×4 IMPLANT
GAUZE SPONGE 4X4 12PLY STRL (GAUZE/BANDAGES/DRESSINGS) ×4 IMPLANT
GAUZE XEROFORM 1X8 LF (GAUZE/BANDAGES/DRESSINGS) ×4 IMPLANT
GLOVE BIO SURGEON STRL SZ7.5 (GLOVE) ×4 IMPLANT
GLOVE BIOGEL PI IND STRL 7.0 (GLOVE) ×2 IMPLANT
GLOVE BIOGEL PI IND STRL 8 (GLOVE) ×2 IMPLANT
GLOVE BIOGEL PI IND STRL 8.5 (GLOVE) ×2 IMPLANT
GLOVE BIOGEL PI INDICATOR 7.0 (GLOVE) ×4
GLOVE BIOGEL PI INDICATOR 8 (GLOVE) ×2
GLOVE BIOGEL PI INDICATOR 8.5 (GLOVE) ×2
GLOVE ECLIPSE 6.5 STRL STRAW (GLOVE) ×6 IMPLANT
GLOVE SURG ORTHO 8.0 STRL STRW (GLOVE) IMPLANT
GOWN STRL REUS W/ TWL LRG LVL3 (GOWN DISPOSABLE) ×2 IMPLANT
GOWN STRL REUS W/ TWL XL LVL3 (GOWN DISPOSABLE) ×1 IMPLANT
GOWN STRL REUS W/TWL LRG LVL3 (GOWN DISPOSABLE) ×4
GOWN STRL REUS W/TWL XL LVL3 (GOWN DISPOSABLE) ×6 IMPLANT
K-WIRE .035X4 (WIRE) ×3 IMPLANT
NDL HYPO 25X1 1.5 SAFETY (NEEDLE) IMPLANT
NEEDLE HYPO 22GX1.5 SAFETY (NEEDLE) IMPLANT
NEEDLE HYPO 25X1 1.5 SAFETY (NEEDLE) IMPLANT
NS IRRIG 1000ML POUR BTL (IV SOLUTION) ×4 IMPLANT
PACK BASIN DAY SURGERY FS (CUSTOM PROCEDURE TRAY) ×4 IMPLANT
PAD CAST 3X4 CTTN HI CHSV (CAST SUPPLIES) ×1 IMPLANT
PAD CAST 4YDX4 CTTN HI CHSV (CAST SUPPLIES) ×1 IMPLANT
PADDING CAST ABS 4INX4YD NS (CAST SUPPLIES) ×2
PADDING CAST ABS COTTON 4X4 ST (CAST SUPPLIES) ×2 IMPLANT
PADDING CAST COTTON 3X4 STRL (CAST SUPPLIES) ×4
PADDING CAST COTTON 4X4 STRL (CAST SUPPLIES)
SLEEVE SCD COMPRESS KNEE MED (MISCELLANEOUS) ×3 IMPLANT
SLING ARM FOAM STRAP XLG (SOFTGOODS) ×3 IMPLANT
SPLINT PLASTER CAST XFAST 3X15 (CAST SUPPLIES) ×20 IMPLANT
SPLINT PLASTER CAST XFAST 4X15 (CAST SUPPLIES) IMPLANT
SPLINT PLASTER XTRA FAST SET 4 (CAST SUPPLIES)
SPLINT PLASTER XTRA FASTSET 3X (CAST SUPPLIES) ×40
STOCKINETTE 4X48 STRL (DRAPES) ×4 IMPLANT
SUT ETHILON 3 0 PS 1 (SUTURE) IMPLANT
SUT ETHILON 4 0 PS 2 18 (SUTURE) ×4 IMPLANT
SUT MERSILENE 4 0 P 3 (SUTURE) IMPLANT
SUT VIC AB 3-0 PS1 18 (SUTURE)
SUT VIC AB 3-0 PS1 18XBRD (SUTURE) IMPLANT
SUT VIC AB 4-0 P-3 18XBRD (SUTURE) ×1 IMPLANT
SUT VIC AB 4-0 P3 18 (SUTURE) ×4
SUT VICRYL 4-0 PS2 18IN ABS (SUTURE) ×4 IMPLANT
SYR BULB 3OZ (MISCELLANEOUS) ×4 IMPLANT
SYR CONTROL 10ML LL (SYRINGE) IMPLANT
TOWEL OR 17X24 6PK STRL BLUE (TOWEL DISPOSABLE) ×5 IMPLANT
TOWEL OR NON WOVEN STRL DISP B (DISPOSABLE) ×3 IMPLANT
UNDERPAD 30X30 (UNDERPADS AND DIAPERS) ×1 IMPLANT

## 2015-05-17 NOTE — Op Note (Signed)
[id number] 

## 2015-05-17 NOTE — Discharge Instructions (Addendum)
[facility] Instructions Hand Surgery  Wound Care: Keep your hand elevated above the level of your heart.  Do not allow it to dangle by your side.  Keep the dressing dry and do not remove it unless your doctor advises you to do so.  He will usually change it at the time of your post-op visit.  Moving your fingers is advised to stimulate circulation but will depend on the site of your surgery.  If you have a splint applied, your doctor will advise you regarding movement.  Activity: Do not drive or operate machinery today.  Rest today and then you may return to your normal activity and work as indicated by your physician.  Diet:  Drink liquids today or eat a light diet.  You may resume a regular diet tomorrow.    General expectations: Pain for two to three days. Fingers may become slightly swollen.  Call your doctor if any of the following occur: Severe pain not relieved by pain medication. Elevated temperature. Dressing soaked with blood. Inability to move fingers. White or bluish color to fingers. Explained use and side effects of bupropion, such as insomnia, dizziness and dry mouth. Cost is sometimes not covered by insurance, but in the long term, the cost is negligible compared to continuing to smoke cigarettes. Take once daily x 3 days, then BID for 8-12 weeks. Try taking the first pill very early in the morning, and the second pill in early afternoon to avoid insomnia. Set a quit date for 1-2 weeks after starting. Will ask pharmacy to supply the information kit with the drug. Follow up with me to check on progress at quitting smoking.Explained use and side effects of bupropion, such as insomnia, dizziness and dry mouth. Cost is sometimes not covered by insurance, but in the long term, the cost is negligible compared to continuing to smoke cigarettes. Take once daily x 3 days, then BID for 8-12 weeks. Try taking the first pill very early in the morning, and the second pill in early afternoon  to avoid insomnia. Set a quit date for 1-2 weeks after starting. Will ask pharmacy to supply the information kit with the drug. Follow up with me to check on progress at quitting smoking.Explained use and side effects of bupropion, such as insomnia, dizziness and dry mouth. Cost is sometimes not covered by insurance, but in the long term, the cost is negligible compared to continuing to smoke cigarettes. Take once daily x 3 days, then BID for 8-12 weeks. Try taking the first pill very early in the morning, and the second pill in early afternoon to avoid insomnia. Set a quit date for 1-2 weeks after starting. Will ask pharmacy to supply the information kit with the drug. Follow up with me to check on progress at quitting smoking.    Post Anesthesia Home Care Instructions  Activity: Get plenty of rest for the remainder of the day. A responsible adult should stay with you for 24 hours following the procedure.  For the next 24 hours, DO NOT: -Drive a car -Paediatric nurse -Drink alcoholic beverages -Take any medication unless instructed by your physician -Make any legal decisions or sign important papers.  Meals: Start with liquid foods such as gelatin or soup. Progress to regular foods as tolerated. Avoid greasy, spicy, heavy foods. If nausea and/or vomiting occur, drink only clear liquids until the nausea and/or vomiting subsides. Call your physician if vomiting continues.  Special Instructions/Symptoms: Your throat may feel dry or sore from the  anesthesia or the breathing tube placed in your throat during surgery. If this causes discomfort, gargle with warm salt water. The discomfort should disappear within 24 hours.  If you had a scopolamine patch placed behind your ear for the management of post- operative nausea and/or vomiting:  1. The medication in the patch is effective for 72 hours, after which it should be removed.  Wrap patch in a tissue and discard in the trash. Wash hands thoroughly  with soap and water. 2. You may remove the patch earlier than 72 hours if you experience unpleasant side effects which may include dry mouth, dizziness or visual disturbances. 3. Avoid touching the patch. Wash your hands with soap and water after contact with the patch.   Regional Anesthesia Blocks  1. Numbness or the inability to move the "blocked" extremity may last from 3-48 hours after placement. The length of time depends on the medication injected and your individual response to the medication. If the numbness is not going away after 48 hours, call your surgeon.  2. The extremity that is blocked will need to be protected until the numbness is gone and the  Strength has returned. Because you cannot feel it, you will need to take extra care to avoid injury. Because it may be weak, you may have difficulty moving it or using it. You may not know what position it is in without looking at it while the block is in effect.  3. For blocks in the legs and feet, returning to weight bearing and walking needs to be done carefully. You will need to wait until the numbness is entirely gone and the strength has returned. You should be able to move your leg and foot normally before you try and bear weight or walk. You will need someone to be with you when you first try to ensure you do not fall and possibly risk injury.  4. Bruising and tenderness at the needle site are common side effects and will resolve in a few days.  5. Persistent numbness or new problems with movement should be communicated to the surgeon or the [facility] [phone] [place] [phone]).

## 2015-05-17 NOTE — H&P (Signed)
  Mark Downs is an 49 y.o. male.   Chief Complaint: right thumb metacarpal fracture HPI: 49 yo lhd male injured right hand with circular saw 04/05/15.  Taken to OR for I&D and repair tendon.  Bone loss to thumb metacarpal.  He wishes to have grafting of the fracture site to aid in bony healing.  History reviewed. No pertinent past medical history.  Past Surgical History  Procedure Laterality Date  . I&d extremity Right 04/05/2015    Procedure: IRRIGATION AND DEBRIDEMENT EXTREMITY WITH TENDON REPAIR;  Surgeon: Betha Loa, MD;  Location: [facility] OR;  Service: Orthopedics;  Laterality: Right;    History reviewed. No pertinent family history. Social History:  reports that he has been smoking Cigarettes.  He has been smoking about 1.00 pack per day. He does not have any smokeless tobacco history on file. He reports that he does not drink alcohol or use illicit drugs.  Allergies: No Known Allergies  No prescriptions prior to admission    No results found for this or any previous visit (from the past 48 hour(s)).  No results found.   A comprehensive review of systems was negative except for: Constitutional: positive for anorexia  Blood pressure 114/72, pulse 77, temperature 98.4 F (36.9 C), temperature source Oral, resp. rate 14, height 6' (1.829 m), weight 92.307 kg (203 lb 8 oz), SpO2 100 %.  General appearance: alert, cooperative and appears stated age Head: Normocephalic, without obvious abnormality, atraumatic Neck: supple, symmetrical, trachea midline Resp: clear to auscultation bilaterally Cardio: regular rate and rhythm GI: non tender Extremities: intact sensation and capillary refill all digits.  +epl/fpl/io.  wounds healed. Pulses: 2+ and symmetric Skin: Skin color, texture, turgor normal. No rashes or lesions Neurologic: Grossly normal Incision/Wound: Healed  Assessment/Plan Right thumb metacarpal fracture with bone loss.  Non operative and operative treatment options  were discussed with the patient and patient wishes to proceed with operative treatment. Recommend bone grafting with possible fixation.  Risks, benefits, and alternatives of surgery were discussed and the patient agrees with the plan of care.   Kaylinn Dedic R 05/17/2015, 12:22 PM

## 2015-05-17 NOTE — Op Note (Signed)
Intra-operative fluoroscopic images in the AP, lateral, and oblique views were taken and evaluated by myself.  Localization of the bone loss site was confirmed and packing of the site with bone graft was confirmed.  The fracture was confirmed to be stable.

## 2015-05-17 NOTE — Anesthesia Preprocedure Evaluation (Addendum)
Anesthesia Evaluation  Patient identified by MRN, date of birth, ID band Patient awake    Reviewed: Allergy & Precautions, NPO status   Airway Mallampati: I  TM Distance: >3 FB Neck ROM: Full    Dental  (+) Teeth Intact, Dental Advisory Given   Pulmonary Current Smoker,  breath sounds clear to auscultation        Cardiovascular Rhythm:Regular Rate:Normal     Neuro/Psych    GI/Hepatic   Endo/Other    Renal/GU      Musculoskeletal   Abdominal   Peds  Hematology   Anesthesia Other Findings   Reproductive/Obstetrics                            Anesthesia Physical Anesthesia Plan  ASA: I  Anesthesia Plan: General and Regional   Post-op Pain Management:    Induction: Intravenous  Airway Management Planned: LMA  Additional Equipment:   Intra-op Plan:   Post-operative Plan: Extubation in OR  Informed Consent: I have reviewed the patients History and Physical, chart, labs and discussed the procedure including the risks, benefits and alternatives for the proposed anesthesia with the patient or authorized representative who has indicated his/her understanding and acceptance.   Dental advisory given  Plan Discussed with: CRNA, Anesthesiologist and Surgeon  Anesthesia Plan Comments:         Anesthesia Quick Evaluation

## 2015-05-17 NOTE — Anesthesia Postprocedure Evaluation (Signed)
  Anesthesia Post-op Note  Patient: Mark Downs  Procedure(s) Performed: Procedure(s): RIGHT THUMB METACARPAL BONE GRAFTING FROM DISTAL RADIUS  (Right)  Patient Location: PACU  Anesthesia Type:General  Level of Consciousness: awake and alert   Airway and Oxygen Therapy: Patient Spontanous Breathing  Post-op Pain: none  Post-op Assessment: Post-op Vital signs reviewed and Patient's Cardiovascular Status Stable              Post-op Vital Signs: Reviewed and stable  Last Vitals:  Filed Vitals:   05/17/15 1425  BP: 127/81  Pulse: 70  Temp: 36.8 C  Resp: 18    Complications: No apparent anesthesia complications

## 2015-05-17 NOTE — Brief Op Note (Signed)
05/17/2015  1:46 PM  PATIENT:  Windell Norfolk  49 y.o. male  PRE-OPERATIVE DIAGNOSIS:  right thumb metacarpal fracture  S62.254 with bone loss  POST-OPERATIVE DIAGNOSIS:  right thumb metacarpal fracture  S62.254 with bone loss  PROCEDURE:  Procedure(s): RIGHT THUMB METACARPAL BONE GRAFTING FROM DISTAL RADIUS  (Right)  SURGEON:  Surgeon(s) and Role:    * Betha Loa, MD - Primary    * Cindee Salt, MD - Assisting  PHYSICIAN ASSISTANT:   ASSISTANTS: Cindee Salt, MD   ANESTHESIA:   regional and general  EBL:  Total I/O In: 1200 [I.V.:1200] Out: -   BLOOD ADMINISTERED:none  DRAINS: none   LOCAL MEDICATIONS USED:  NONE  SPECIMEN:  No Specimen  DISPOSITION OF SPECIMEN:  N/A  COUNTS:  YES  TOURNIQUET:   Total Tourniquet Time Documented: Upper Arm (Right) - 39 minutes Total: Upper Arm (Right) - 39 minutes   DICTATION: .Other Dictation: Dictation Number [id number]  PLAN OF CARE: Discharge to home after PACU  PATIENT DISPOSITION:  PACU - hemodynamically stable.

## 2015-05-17 NOTE — Progress Notes (Signed)
Assisted Dr. Crews with right, ultrasound guided, supraclavicular block. Side rails up, monitors on throughout procedure. See vital signs in flow sheet. Tolerated Procedure well. 

## 2015-05-17 NOTE — Transfer of Care (Signed)
Immediate Anesthesia Transfer of Care Note  Patient: Mark Downs  Procedure(s) Performed: Procedure(s): RIGHT THUMB METACARPAL BONE GRAFTING FROM DISTAL RADIUS  (Right)  Patient Location: PACU  Anesthesia Type:GA combined with regional for post-op pain  Level of Consciousness: awake, alert , oriented and patient cooperative  Airway & Oxygen Therapy: Patient Spontanous Breathing and Patient connected to face mask oxygen  Post-op Assessment: Report given to RN and Post -op Vital signs reviewed and stable  Post vital signs: Reviewed and stable  Last Vitals:  Filed Vitals:   05/17/15 1223  BP:   Pulse: 84  Temp:   Resp: 14    Complications: No apparent anesthesia complications

## 2015-05-17 NOTE — Anesthesia Procedure Notes (Addendum)
Anesthesia Regional Block:  Supraclavicular block  Pre-Anesthetic Checklist: ,, timeout performed, Correct Patient, Correct Site, Correct Laterality, Correct Procedure, Correct Position, site marked, Risks and benefits discussed,  Surgical consent,  Pre-op evaluation,  At surgeon's request and post-op pain management  Laterality: Right and Upper  Prep: chloraprep       Needles:  Injection technique: Single-shot  Needle Type: Echogenic Stimulator Needle     Needle Length: 5cm 5 cm Needle Gauge: 21 and 21 G    Additional Needles:  Procedures: ultrasound guided (picture in chart) Supraclavicular block Narrative:  Start time: 05/17/2015 12:03 PM End time: 05/17/2015 12:10 PM Injection made incrementally with aspirations every 5 mL.  Performed by: Personally  Anesthesiologist: CREWS, Imani   Procedure Name: LMA Insertion Date/Time: 05/17/2015 12:49 PM Performed by: Lamari Beckles D Pre-anesthesia Checklist: Patient identified, Emergency Drugs available, Suction available and Patient being monitored Patient Re-evaluated:Patient Re-evaluated prior to inductionOxygen Delivery Method: Circle System Utilized Preoxygenation: Pre-oxygenation with 100% oxygen Intubation Type: IV induction Ventilation: Mask ventilation without difficulty LMA: LMA inserted LMA Size: 4.0 Number of attempts: 1 Airway Equipment and Method: Bite block Placement Confirmation: positive ETCO2 Tube secured with: Tape Dental Injury: Teeth and Oropharynx as per pre-operative assessment

## 2015-05-18 ENCOUNTER — Encounter (HOSPITAL_BASED_OUTPATIENT_CLINIC_OR_DEPARTMENT_OTHER): Payer: Self-pay | Admitting: Orthopedic Surgery

## 2015-05-18 NOTE — Addendum Note (Signed)
Addendum  created 05/18/15 1038 by Lance Coon, CRNA   Modules edited: Charges VN

## 2015-05-19 NOTE — Op Note (Signed)
NAME:  Mark Downs, Mark Downs                   ACCOUNT NO.:  MEDICAL RECORD NO.:  [id number]  LOCATION:                                 FACILITY:  PHYSICIAN:  Betha Loa, MD        DATE OF BIRTH:  December 08, 1965  DATE OF PROCEDURE:  05/17/2015 DATE OF DISCHARGE:                              OPERATIVE REPORT   PREOPERATIVE DIAGNOSIS:  Right thumb metacarpal fracture with bone loss.  POSTOPERATIVE DIAGNOSIS:  Right thumb metacarpal fracture with bone loss.  PROCEDURE:  Right thumb metacarpal bone grafting from distal radius.  SURGEON:  Betha Loa, MD  ASSISTANT:  Cindee Salt, M.D.  ANESTHESIA:  General with regional.  IV FLUIDS:  Per anesthesia flow sheet.  ESTIMATED BLOOD LOSS:  Minimal.  COMPLICATIONS:  None.  SPECIMENS:  None.  TOURNIQUET TIME:  39 minutes.  DISPOSITION:  Stable to PACU.  INDICATIONS:  Mr. Demarest is a 49 year old left-hand-dominant male, who approximately 6 weeks ago sustained a circular saw injury to his right thumb.  This resulted in bone loss at the distal aspect of the metacarpal.  He returns to the OR today for bone grafting of the fracture site with possible fixation.  Risks, benefits and alternatives of the surgery were discussed including the risk of blood loss; infection; damage to nerves, vessels, tendons, ligaments, bone; failure of surgery; need for additional surgery; complications with wound healing; continued pain; nonunion; malunion; stiffness.  He voiced understanding of these risks and elected to proceed.  OPERATIVE COURSE:  After being identified preoperatively by myself, the patient and I agreed upon the procedure and site of procedure.  Surgical site was marked.  The risks, benefits, and alternatives of the surgery were reviewed and he wished to proceed.  Surgical consent had been signed.  He was given IV Ancef as preoperative antibiotic prophylaxis. A regional block was performed by Anesthesia in the preoperative holding.  He was  transferred to the operating room and placed on the operating room table in supine position with the right upper extremity on an armboard.  General anesthesia was induced by anesthesiologist. Right upper extremity was prepped and draped in normal sterile orthopedic fashion.  A surgical pause was performed between the surgeons, anesthesia, and operating room staff, and all were in agreement as to the patient, procedure, and site of procedure. Tourniquet at the proximal aspect of the extremity was inflated to 250 mmHg after exsanguination of the limb with an Esmarch bandage.  An incision was made using the previous wound.  This was carried into subcutaneous tissues by spreading technique.  The wound was followed down to the bone.  The bone loss site was identified.  It was cleared of fibrous tissue with curette and rongeurs.  C-arm was used in AP and lateral projections to ensure localization of the bone loss site. Incision was made at the dorsum of the wrist, carried into subcutaneous tissues by spreading technique.  Lister's tubercle was identified.  The periosteum was incised and elevated.  The medullary canal was entered by using 0.035-inch K-wire followed by osteotomes to remove a bone plug. Cancellous bone graft was harvested.  This was then packed into  the bone loss side and the thumb metacarpal.  C-arm was used in AP, lateral, and oblique projections to ensure appropriate packing of the metacarpal bone loss site, which was the case.  The wound had been copiously irrigated with sterile saline.  The deep soft tissue was repaired with 4-0 Vicryl suture in a figure-of-eight fashion to hold the bone graft in.  The skin was closed with 4-0 nylon in a horizontal mattress fashion.  The periosteum at the dorsal wrist was repaired with the 4-0 Vicryl suture and skin closed with 4-0 nylon in a horizontal mattress fashion.  The wounds were dressed with sterile Xeroform, 4x4s, and wrapped with  Kerlix bandage.  Thumb spica splint was placed and wrapped with Kerlix and Ace bandage.  Tourniquet was deflated at 39 minutes.  Fingertips were pink with brisk capillary refill after deflation of the tourniquet. Operative drapes were broken down and the patient was awakened from anesthesia safely.  He was transferred back to the stretcher and taken to the PACU in stable condition.  I will see him back in the office 1 week for postoperative followup.  I will give him Norco 5/325, 1-2 p.o. q.6 hours p.r.n. pain, dispensed #40.     Betha Loa, MD     KK/MEDQ  D:  05/17/2015  T:  05/18/2015  Job:  [id number]

## 2016-12-25 IMAGING — DX DG HAND COMPLETE 3+V*R*
3 series · 3 of 3 positions shown · non-contrast
Comparison: None.

CLINICAL DATA: 49-year-old male with laceration at the base of the
thumb

EXAM:
RIGHT HAND - COMPLETE 3+ VIEW

[x hand pa right]
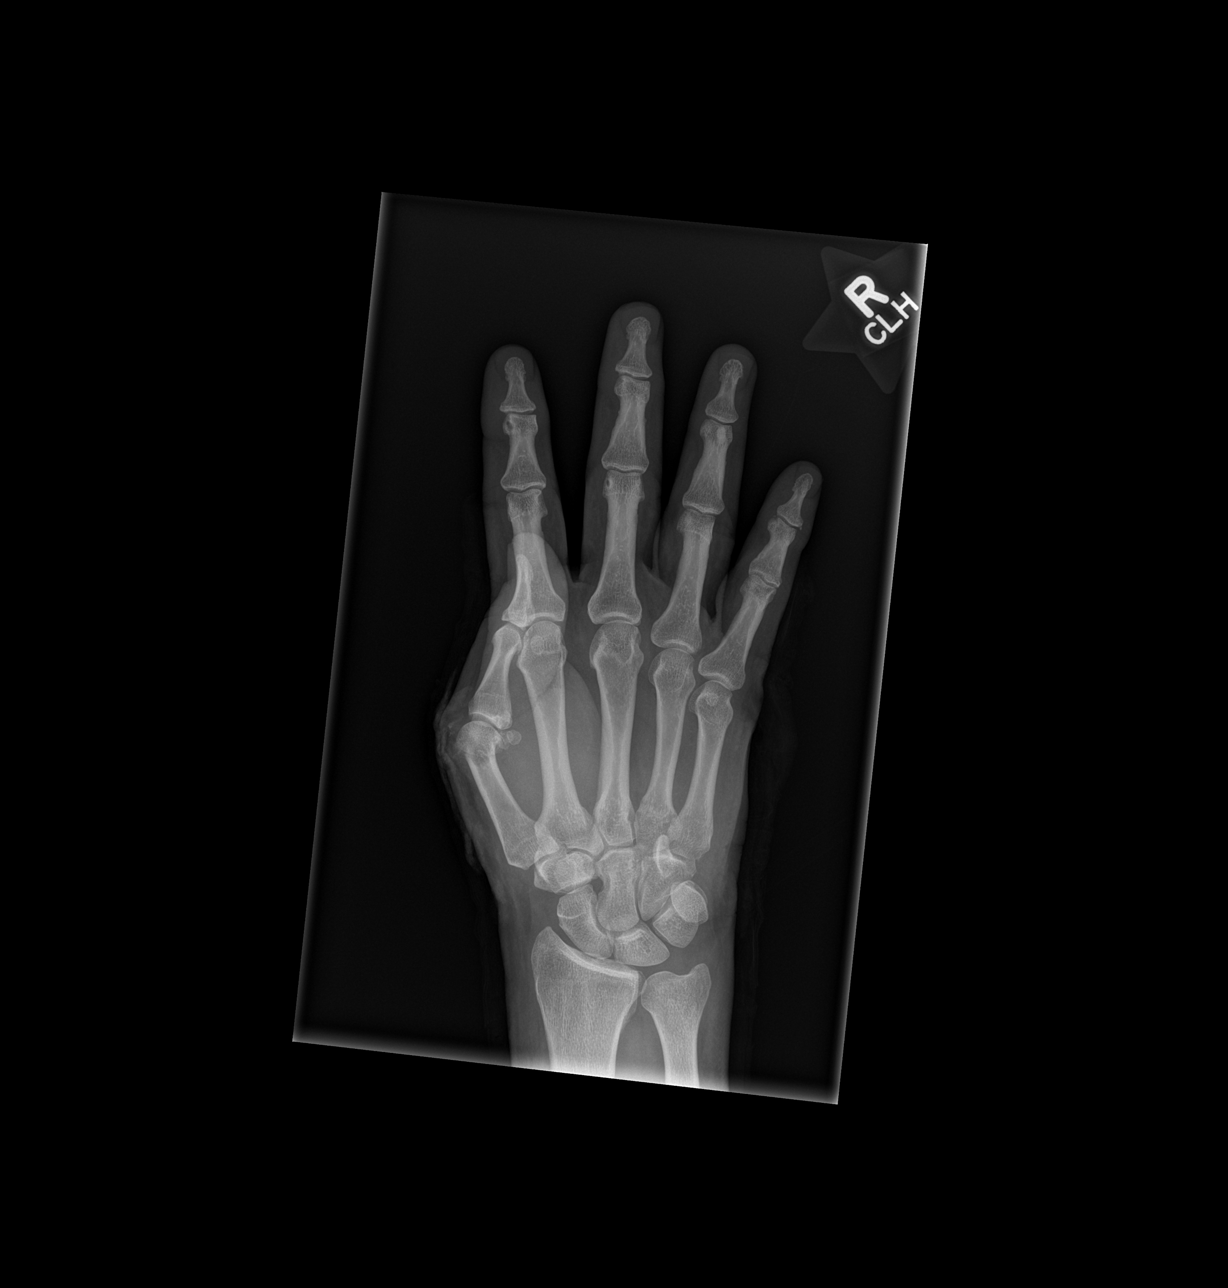

[x hand obl right]
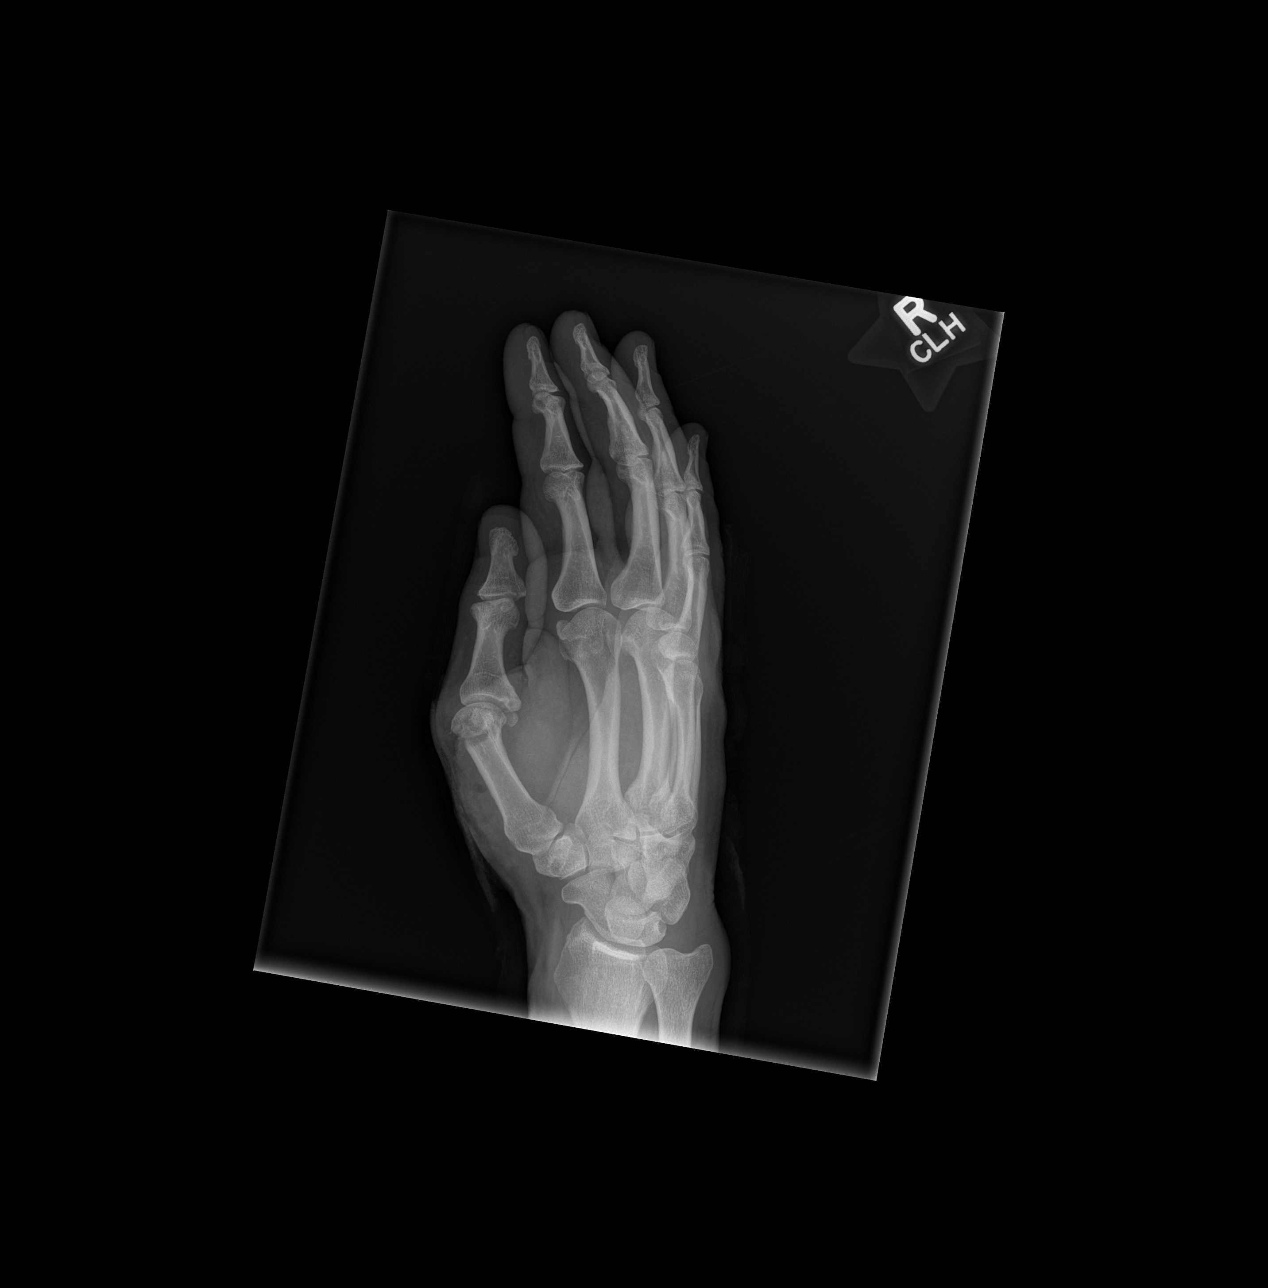

[x hand lat right]
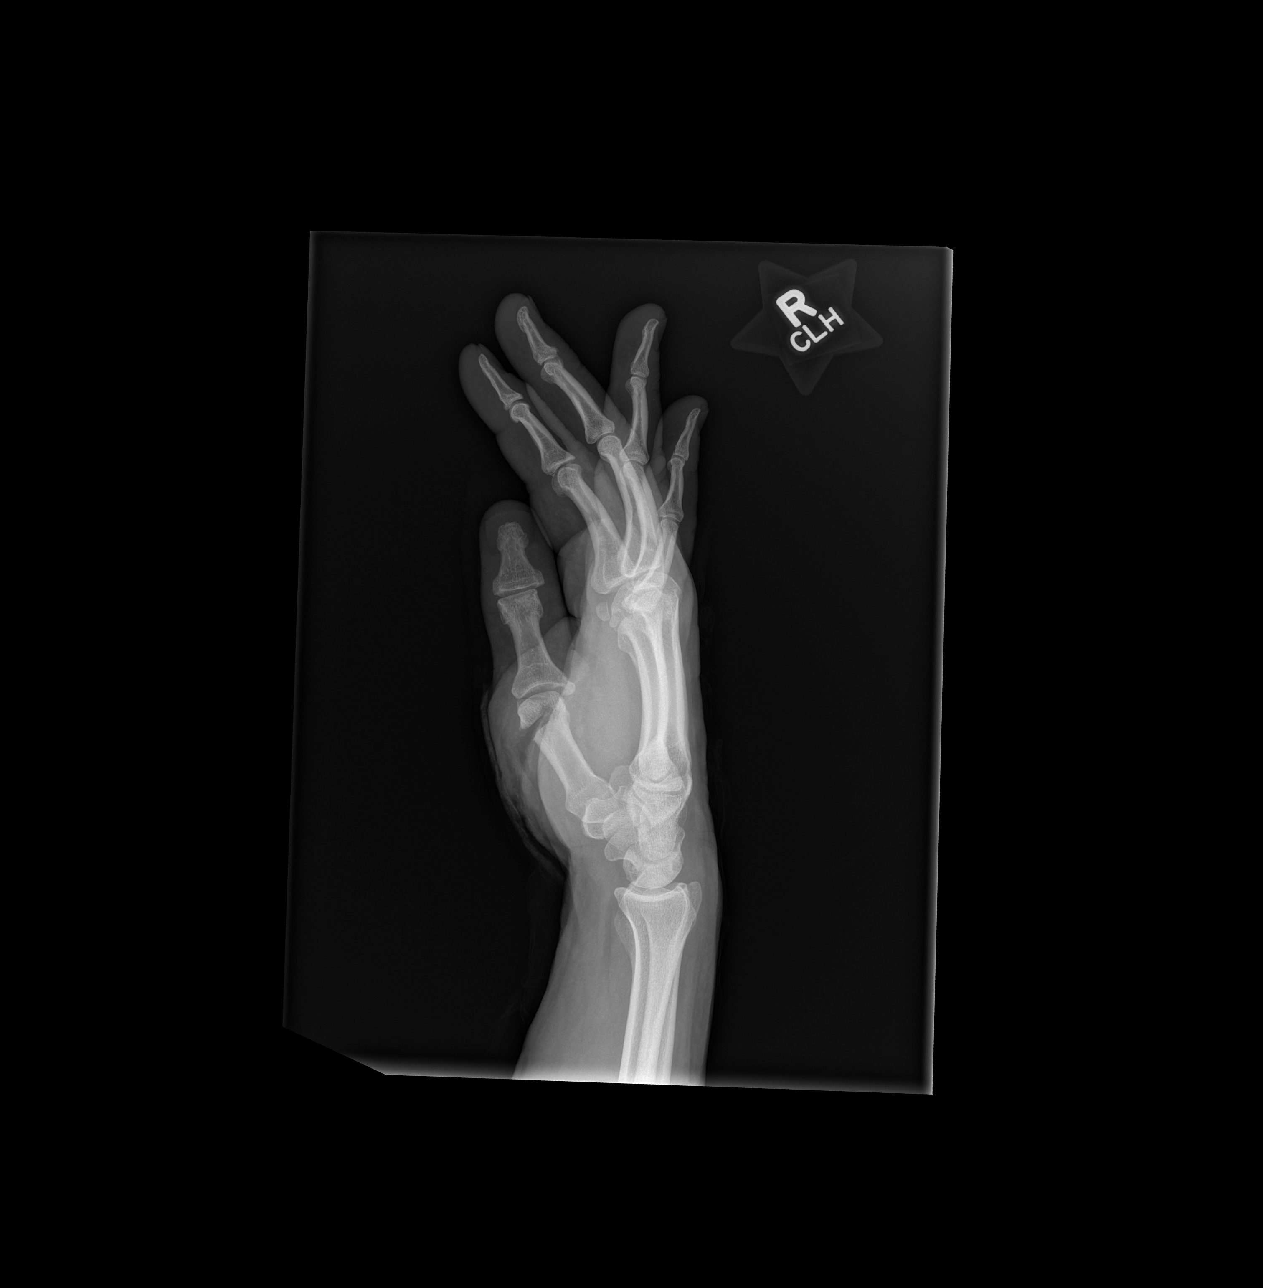

[3 of 3 positions shown; findings below may reference images not displayed]

FINDINGS: There is soft tissue laceration of the proximal portion of the
thumb. There is a transverse fracture through the distal portion of
the first metacarpal. Note radiopaque foreign object identified.
IMPRESSION: Laceration of the soft tissues of the thumb with Fracture of the
distal first metacarpal.

## 2020-06-29 ENCOUNTER — Other Ambulatory Visit: Payer: PRIVATE HEALTH INSURANCE

## 2020-07-07 ENCOUNTER — Emergency Department (HOSPITAL_COMMUNITY): Payer: BC Managed Care – PPO

## 2020-07-07 ENCOUNTER — Encounter (HOSPITAL_COMMUNITY): Payer: Self-pay | Admitting: *Deleted

## 2020-07-07 ENCOUNTER — Other Ambulatory Visit: Payer: Self-pay

## 2020-07-07 ENCOUNTER — Emergency Department (HOSPITAL_COMMUNITY)
Admission: EM | Admit: 2020-07-07 | Discharge: 2020-07-07 | Disposition: A | Discharge status: Home / Self Care | Payer: BC Managed Care – PPO | Attending: Emergency Medicine | Admitting: Emergency Medicine

## 2020-07-07 DIAGNOSIS — U071 COVID-19: Secondary | ICD-10-CM | POA: Diagnosis not present

## 2020-07-07 DIAGNOSIS — F17210 Nicotine dependence, cigarettes, uncomplicated: Secondary | ICD-10-CM | POA: Insufficient documentation

## 2020-07-07 DIAGNOSIS — E876 Hypokalemia: Secondary | ICD-10-CM | POA: Diagnosis not present

## 2020-07-07 DIAGNOSIS — R0602 Shortness of breath: Secondary | ICD-10-CM | POA: Diagnosis present

## 2020-07-07 LAB — BASIC METABOLIC PANEL
Anion gap: 13 (ref 5–15)
BUN: 7 mg/dL (ref 6–20)
CO2: 23 mmol/L (ref 22–32)
Calcium: 8.5 mg/dL — ABNORMAL LOW (ref 8.9–10.3)
Chloride: 92 mmol/L — ABNORMAL LOW (ref 98–111)
Creatinine, Ser: 0.84 mg/dL (ref 0.61–1.24)
GFR, Estimated: >60 mL/min (ref >60)
Glucose, Bld: 109 mg/dL — ABNORMAL HIGH (ref 70–99)
Potassium: 2.8 mmol/L — ABNORMAL LOW (ref 3.5–5.1)
Sodium: 128 mmol/L — ABNORMAL LOW (ref 135–145)

## 2020-07-07 LAB — CBC
HCT: 43.3 % (ref 39.0–52.0)
Hemoglobin: 14.4 g/dL (ref 13.0–17.0)
MCH: 29.1 pg (ref 26.0–34.0)
MCHC: 33.3 g/dL (ref 30.0–36.0)
MCV: 87.5 fL (ref 80.0–100.0)
Platelets: 126 10*3/uL — ABNORMAL LOW (ref 150–400)
RBC: 4.95 MIL/uL (ref 4.22–5.81)
RDW: 13.2 % (ref 11.5–15.5)
WBC: 5 10*3/uL (ref 4.0–10.5)
nRBC: 0 % (ref 0.0–0.2)

## 2020-07-07 MED ORDER — POTASSIUM CHLORIDE CRYS ER 20 MEQ PO TBCR: 20.0000 meq | EXTENDED_RELEASE_TABLET | Freq: Two times a day (BID) | ORAL | 0 refills | Status: DC

## 2020-07-07 NOTE — ED Provider Notes (Signed)
[facility] EMERGENCY DEPARTMENT Provider Note   CSN: [id number] Arrival date & time: 07/07/20  0036     History No chief complaint on file.   Einar Nolasco is a 54 y.o. male.  Patient is a 54 year old otherwise healthy male presenting with complaints of shortness of breath.  He has had URI symptoms for the past 5 days.  He was diagnosed 3 days ago with COVID-19.  Yesterday evening he began to feel somewhat short of breath and presents for evaluation of this.  He denies any chest pain, fevers, or chills.  He denies any leg swelling.  The history is provided by the patient.       History reviewed. No pertinent past medical history.  There are no problems to display for this patient.   Past Surgical History:  Procedure Laterality Date  . I & D EXTREMITY Right 04/05/2015   Procedure: IRRIGATION AND DEBRIDEMENT EXTREMITY WITH TENDON REPAIR;  Surgeon: Betha Loa, MD;  Location: [facility] OR;  Service: Orthopedics;  Laterality: Right;  . OPEN REDUCTION INTERNAL FIXATION (ORIF) FINGER WITH RADIAL BONE GRAFT Right 05/17/2015   Procedure: RIGHT THUMB METACARPAL BONE GRAFTING FROM DISTAL RADIUS ;  Surgeon: Betha Loa, MD;  Location: [facility];  Service: Orthopedics;  Laterality: Right;       No family history on file.  Social History   Tobacco Use  . Smoking status: Current Every Day Smoker    Packs/day: 1.00    Types: Cigarettes  . Smokeless tobacco: Never Used  Substance Use Topics  . Alcohol use: No  . Drug use: No    Home Medications Prior to Admission medications   Medication Sig Start Date End Date Taking? Authorizing Provider  HYDROcodone-acetaminophen [facility]) 5-325 MG per tablet 1-2 tabs po q6 hours prn pain 05/17/15   Betha Loa, MD    Allergies    Patient has no known allergies.  Review of Systems   Review of Systems  All other systems reviewed and are negative.   Physical Exam Updated Vital Signs BP (!) 146/89 (BP  Location: Right Arm)   Pulse 62   Temp 98.1 F (36.7 C) (Oral)   Resp 17   Ht 6' (1.829 m)   Wt 93 kg   SpO2 100%   BMI 27.80 kg/m   Physical Exam Vitals and nursing note reviewed.  Constitutional:      General: He is not in acute distress.    Appearance: He is well-developed. He is not diaphoretic.  HENT:     Head: Normocephalic and atraumatic.  Cardiovascular:     Rate and Rhythm: Normal rate and regular rhythm.     Heart sounds: No murmur heard.  No friction rub.  Pulmonary:     Effort: Pulmonary effort is normal. No respiratory distress.     Breath sounds: Normal breath sounds. No wheezing or rales.  Abdominal:     General: Bowel sounds are normal. There is no distension.     Palpations: Abdomen is soft.     Tenderness: There is no abdominal tenderness.  Musculoskeletal:        General: Normal range of motion.     Cervical back: Normal range of motion and neck supple.  Skin:    General: Skin is warm and dry.  Neurological:     Mental Status: He is alert and oriented to person, place, and time.     Coordination: Coordination normal.     ED Results / Procedures /  Treatments   Labs (all labs ordered are listed, but only abnormal results are displayed) Labs Reviewed  BASIC METABOLIC PANEL - Abnormal; Notable for the following components:      Result Value   Sodium 128 (*)    Potassium 2.8 (*)    Chloride 92 (*)    Glucose, Bld 109 (*)    Calcium 8.5 (*)    All other components within normal limits  CBC - Abnormal; Notable for the following components:   Platelets 126 (*)    All other components within normal limits    EKG EKG Interpretation  Date/Time:  Thursday July 07 2020 00:51:53 EDT Ventricular Rate:  85 PR Interval:  166 QRS Duration: 82 QT Interval:  374 QTC Calculation: 445 R Axis:   43 Text Interpretation: Normal sinus rhythm Normal ECG No old tracing to compare Confirmed by Dione Booze ([id number]) on 07/07/2020 2:13:41 AM   Radiology DG  Chest Portable 1 View  Result Date: 07/07/2020 CLINICAL DATA:  covid positive, chest congestion EXAM: PORTABLE CHEST 1 VIEW COMPARISON:  None. FINDINGS: The heart size and mediastinal contours are within normal limits. Left base linear atelectasis. No focal consolidation. No pulmonary edema. No pleural effusion. No pneumothorax. No acute osseous abnormality. IMPRESSION: No acute cardiopulmonary disease. Electronically Signed   By: Tish Frederickson M.D.   On: 07/07/2020 01:15    Procedures Procedures (including critical care time)  Medications Ordered in ED Medications - No data to display  ED Course  I have reviewed the triage vital signs and the nursing notes.  Pertinent labs & imaging results that were available during my care of the patient were reviewed by me and considered in my medical decision making (see chart for details).    MDM Rules/Calculators/A&P  Patient is a 54 year old male otherwise healthy presenting with complaints of shortness of breath and recent diagnosis of COVID-19.  Patient's oxygen saturations are 98% with a heart rate in the 60s and respiratory rate of 16 during my exam.  He is in no respiratory distress and physical examination is unremarkable.  His chest x-ray is clear, EKG is normal, and laboratory studies showed no significant abnormalities with the exception of a potassium of 2.8.  Patient advised to eat potassium rich foods and have this rechecked at some point in the near future.  At this point, I see no indication for admission or additional therapy.  He is not a candidate for monoclonal antibody infusion.  Patient to be discharged with home isolation guidelines and as needed return if he worsens.  Kelin Borum was evaluated in Emergency Department on 07/07/2020 for the symptoms described in the history of present illness. He was evaluated in the context of the global COVID-19 pandemic, which necessitated consideration that the patient might be at risk  for infection with the SARS-CoV-2 virus that causes COVID-19. Institutional protocols and algorithms that pertain to the evaluation of patients at risk for COVID-19 are in a state of rapid change based on information released by regulatory bodies including the CDC and federal and state organizations. These policies and algorithms were followed during the patient's care in the ED.  Final Clinical Impression(s) / ED Diagnoses Final diagnoses:  None    Rx / DC Orders ED Discharge Orders    None       Geoffery Lyons, MD 07/07/20 [phone]

## 2020-07-07 NOTE — Discharge Instructions (Signed)
Drink plenty of fluids and get plenty of rest.  Take potassium supplement as prescribed today.  Take over-the-counter medications as needed for symptom relief.  Isolate at home for 10 days following symptom onset.  Return to the emergency department if you develop severe difficulty breathing, severe chest pain, or other new and concerning symptoms.  You should also follow-up with your primary doctor in the next week for a recheck of your potassium level.      Person Under Monitoring Name: Mark Downs  Location: [address] [place] [address]   Infection Prevention Recommendations for Individuals Confirmed to have, or Being Evaluated for, 2019 Novel Coronavirus (COVID-19) Infection Who Receive Care at Home  Individuals who are confirmed to have, or are being evaluated for, COVID-19 should follow the prevention steps below until a healthcare provider or local or state health department says they can return to normal activities.  Stay home except to get medical care You should restrict activities outside your home, except for getting medical care. Do not go to work, school, or public areas, and do not use public transportation or taxis.  Call ahead before visiting your doctor Before your medical appointment, call the healthcare provider and tell them that you have, or are being evaluated for, COVID-19 infection. This will help the healthcare provider's office take steps to keep other people from getting infected. Ask your healthcare provider to call the local or state health department.  Monitor your symptoms Seek prompt medical attention if your illness is worsening (e.g., difficulty breathing). Before going to your medical appointment, call the healthcare provider and tell them that you have, or are being evaluated for, COVID-19 infection. Ask your healthcare provider to call the local or state health department.  Wear a facemask You should wear a facemask that covers  your nose and mouth when you are in the same room with other people and when you visit a healthcare provider. People who live with or visit you should also wear a facemask while they are in the same room with you.  Separate yourself from other people in your home As much as possible, you should stay in a different room from other people in your home. Also, you should use a separate bathroom, if available.  Avoid sharing household items You should not share dishes, drinking glasses, cups, eating utensils, towels, bedding, or other items with other people in your home. After using these items, you should wash them thoroughly with soap and water.  Cover your coughs and sneezes Cover your mouth and nose with a tissue when you cough or sneeze, or you can cough or sneeze into your sleeve. Throw used tissues in a lined trash can, and immediately wash your hands with soap and water for at least 20 seconds or use an alcohol-based hand rub.  Wash your [employer] your hands often and thoroughly with soap and water for at least 20 seconds. You can use an alcohol-based hand sanitizer if soap and water are not available and if your hands are not visibly dirty. Avoid touching your eyes, nose, and mouth with unwashed hands.   Prevention Steps for Caregivers and Household Members of Individuals Confirmed to have, or Being Evaluated for, COVID-19 Infection Being Cared for in the Home  If you live with, or provide care at home for, a person confirmed to have, or being evaluated for, COVID-19 infection please follow these guidelines to prevent infection:  Follow healthcare provider's instructions Make sure that you understand  and can help the patient follow any healthcare provider instructions for all care.  Provide for the patient's basic needs You should help the patient with basic needs in the home and provide support for getting groceries, prescriptions, and other personal needs.  Monitor the  patient's symptoms If they are getting sicker, call his or her medical provider and tell them that the patient has, or is being evaluated for, COVID-19 infection. This will help the healthcare provider's office take steps to keep other people from getting infected. Ask the healthcare provider to call the local or state health department.  Limit the number of people who have contact with the patient If possible, have only one caregiver for the patient. Other household members should stay in another home or place of residence. If this is not possible, they should stay in another room, or be separated from the patient as much as possible. Use a separate bathroom, if available. Restrict visitors who do not have an essential need to be in the home.  Keep older adults, very young children, and other sick people away from the patient Keep older adults, very young children, and those who have compromised immune systems or chronic health conditions away from the patient. This includes people with chronic heart, lung, or kidney conditions, diabetes, and cancer.  Ensure good ventilation Make sure that shared spaces in the home have good air flow, such as from an air conditioner or an opened window, weather permitting.  Wash your hands often Wash your hands often and thoroughly with soap and water for at least 20 seconds. You can use an alcohol based hand sanitizer if soap and water are not available and if your hands are not visibly dirty. Avoid touching your eyes, nose, and mouth with unwashed hands. Use disposable paper towels to dry your hands. If not available, use dedicated cloth towels and replace them when they become wet.  Wear a facemask and gloves Wear a disposable facemask at all times in the room and gloves when you touch or have contact with the patient's blood, body fluids, and/or secretions or excretions, such as sweat, saliva, sputum, nasal mucus, vomit, urine, or feces.  Ensure the mask  fits over your nose and mouth tightly, and do not touch it during use. Throw out disposable facemasks and gloves after using them. Do not reuse. Wash your hands immediately after removing your facemask and gloves. If your personal clothing becomes contaminated, carefully remove clothing and launder. Wash your hands after handling contaminated clothing. Place all used disposable facemasks, gloves, and other waste in a lined container before disposing them with other household waste. Remove gloves and wash your hands immediately after handling these items.  Do not share dishes, glasses, or other household items with the patient Avoid sharing household items. You should not share dishes, drinking glasses, cups, eating utensils, towels, bedding, or other items with a patient who is confirmed to have, or being evaluated for, COVID-19 infection. After the person uses these items, you should wash them thoroughly with soap and water.  Wash laundry thoroughly Immediately remove and wash clothes or bedding that have blood, body fluids, and/or secretions or excretions, such as sweat, saliva, sputum, nasal mucus, vomit, urine, or feces, on them. Wear gloves when handling laundry from the patient. Read and follow directions on labels of laundry or clothing items and detergent. In general, wash and dry with the warmest temperatures recommended on the label.  Clean all areas the individual has used often Clean  all touchable surfaces, such as counters, tabletops, doorknobs, bathroom fixtures, toilets, phones, keyboards, tablets, and bedside tables, every day. Also, clean any surfaces that may have blood, body fluids, and/or secretions or excretions on them. Wear gloves when cleaning surfaces the patient has come in contact with. Use a diluted bleach solution (e.g., dilute bleach with 1 part bleach and 10 parts water) or a household disinfectant with a label that says EPA-registered for coronaviruses. To make a  bleach solution at home, add 1 tablespoon of bleach to 1 quart (4 cups) of water. For a larger supply, add  cup of bleach to 1 gallon (16 cups) of water. Read labels of cleaning products and follow recommendations provided on product labels. Labels contain instructions for safe and effective use of the cleaning product including precautions you should take when applying the product, such as wearing gloves or eye protection and making sure you have good ventilation during use of the product. Remove gloves and wash hands immediately after cleaning.  Monitor yourself for signs and symptoms of illness Caregivers and household members are considered close contacts, should monitor their health, and will be asked to limit movement outside of the home to the extent possible. Follow the monitoring steps for close contacts listed on the symptom monitoring form.   ? If you have additional questions, contact your local health department or call the epidemiologist on call at [phone] (available 24/7). ? This guidance is subject to change. For the most up-to-date guidance from [facility], please refer to their website: [email or web address]

## 2020-07-07 NOTE — ED Triage Notes (Signed)
The pt had a pos covd test on Monday.  Today he has had chest congestion and could not get his breath  No breathing difficulty at present  No temp

## 2020-07-07 NOTE — ED Notes (Signed)
Patient verbalizes understanding of discharge instructions. Opportunity for questioning and answers were provided. Pt discharged from ED. 

## 2022-07-27 ENCOUNTER — Other Ambulatory Visit (HOSPITAL_BASED_OUTPATIENT_CLINIC_OR_DEPARTMENT_OTHER): Payer: Self-pay | Admitting: Family Medicine

## 2022-07-27 ENCOUNTER — Ambulatory Visit (HOSPITAL_BASED_OUTPATIENT_CLINIC_OR_DEPARTMENT_OTHER)
Admission: RE | Admit: 2022-07-27 | Discharge: 2022-07-27 | Disposition: A | Discharge status: Home / Self Care | Payer: BC Managed Care – PPO | Source: Ambulatory Visit | Attending: Family Medicine | Admitting: Family Medicine

## 2022-07-27 DIAGNOSIS — R002 Palpitations: Secondary | ICD-10-CM | POA: Diagnosis present

## 2022-07-27 DIAGNOSIS — R0789 Other chest pain: Secondary | ICD-10-CM

## 2022-07-27 DIAGNOSIS — R0602 Shortness of breath: Secondary | ICD-10-CM

## 2022-07-27 MED ORDER — IOHEXOL 350 MG/ML SOLN
100.0000 mL | Freq: Once | INTRAVENOUS | Status: AC | PRN
  Administered 2022-07-27: 100 mL via INTRAVENOUS

## 2022-11-07 ENCOUNTER — Ambulatory Visit (INDEPENDENT_AMBULATORY_CARE_PROVIDER_SITE_OTHER): Payer: BC Managed Care – PPO | Admitting: Internal Medicine

## 2022-11-07 ENCOUNTER — Encounter: Payer: Self-pay | Admitting: Internal Medicine

## 2022-11-07 VITALS — BP 112/71 | HR 85 | Ht 72.0 in | Wt 210.0 lb

## 2022-11-07 DIAGNOSIS — Z1329 Encounter for screening for other suspected endocrine disorder: Secondary | ICD-10-CM | POA: Diagnosis not present

## 2022-11-07 DIAGNOSIS — Z131 Encounter for screening for diabetes mellitus: Secondary | ICD-10-CM | POA: Diagnosis not present

## 2022-11-07 DIAGNOSIS — R5383 Other fatigue: Secondary | ICD-10-CM

## 2022-11-07 DIAGNOSIS — Z0001 Encounter for general adult medical examination with abnormal findings: Secondary | ICD-10-CM | POA: Diagnosis not present

## 2022-11-07 DIAGNOSIS — Z1159 Encounter for screening for other viral diseases: Secondary | ICD-10-CM

## 2022-11-07 DIAGNOSIS — Z1321 Encounter for screening for nutritional disorder: Secondary | ICD-10-CM

## 2022-11-07 DIAGNOSIS — Z720 Tobacco use: Secondary | ICD-10-CM

## 2022-11-07 DIAGNOSIS — E782 Mixed hyperlipidemia: Secondary | ICD-10-CM | POA: Diagnosis not present

## 2022-11-07 DIAGNOSIS — R0789 Other chest pain: Secondary | ICD-10-CM

## 2022-11-07 DIAGNOSIS — R0602 Shortness of breath: Secondary | ICD-10-CM

## 2022-11-07 DIAGNOSIS — Z114 Encounter for screening for human immunodeficiency virus [HIV]: Secondary | ICD-10-CM

## 2022-11-07 DIAGNOSIS — R002 Palpitations: Secondary | ICD-10-CM

## 2022-11-07 NOTE — Progress Notes (Signed)
New Patient Office Visit  Subjective    Patient ID: Mark Downs, male    DOB: [birth year]  Age: 57 y.o. MRN: [id number]  CC:  Chief Complaint  Patient presents with   Establish Care   HPI Mark Downs presents to establish care.  He is a 57 year old male who endorses a past medical history significant for hyperlipidemia and current tobacco use.  He was previously followed at [facility] in [place].  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  He endorses current tobacco use, smoking 1 pack of cigarettes per day and has been doing so for at least 30 years.  His alcohol or illicit drug use.  His family medical history is significant for lung cancer and esophageal cancer.  His acute concern today aside from desiring to establish care is recent symptoms of dyspnea with exertion as well as chest discomfort.  He has previously discussed this with his PCP and underwent CTA, which was negative for PE.  His symptoms began after having COVID-19 last fall.  He is concerned about the persistence of his symptoms without clear etiology.  He is otherwise asymptomatic.  Acute concerns, chronic medical conditions, and outstanding preventative care items discussed today are individually addressed in A/P below.  Outpatient Encounter Medications as of 11/07/2022  Medication Sig   [DISCONTINUED] HYDROcodone-acetaminophen (NORCO) 5-325 MG per tablet 1-2 tabs po q6 hours prn pain   [DISCONTINUED] potassium chloride SA (KLOR-CON) 20 MEQ tablet Take 1 tablet (20 mEq total) by mouth 2 (two) times daily.   No facility-administered encounter medications on file as of 11/07/2022.   History reviewed. No pertinent past medical history.  Past Surgical History:  Procedure Laterality Date   I & D EXTREMITY Right 04/05/2015   Procedure: IRRIGATION AND DEBRIDEMENT EXTREMITY WITH TENDON REPAIR;  Surgeon: Leanora Cover, MD;  Location: [facility];  Service: Orthopedics;  Laterality: Right;   OPEN REDUCTION  INTERNAL FIXATION (ORIF) FINGER WITH RADIAL BONE GRAFT Right 05/17/2015   Procedure: RIGHT THUMB METACARPAL BONE GRAFTING FROM DISTAL RADIUS ;  Surgeon: Leanora Cover, MD;  Location: [facility];  Service: Orthopedics;  Laterality: Right;   History reviewed. No pertinent family history.  Social History   Socioeconomic History   Marital status: Married    Spouse name: Not on file   Number of children: Not on file   Years of education: Not on file   Highest education level: Not on file  Occupational History   Not on file  Tobacco Use   Smoking status: Every Day    Packs/day: 1.00    Types: Cigarettes   Smokeless tobacco: Never  Substance and Sexual Activity   Alcohol use: No   Drug use: No   Sexual activity: Not on file  Other Topics Concern   Not on file  Social History Narrative   Not on file   Social Determinants of Health   Financial Resource Strain: Not on file  Food Insecurity: Not on file  Transportation Needs: Not on file  Physical Activity: Not on file  Stress: Not on file  Social Connections: Not on file  Intimate Partner Violence: Not on file   Review of Systems  Constitutional:  Positive for malaise/fatigue.  Respiratory:  Positive for shortness of breath (Dyspnea with exertion).   Cardiovascular:  Positive for chest pain (Central, nonradiating chest discomfort).  All other systems reviewed and are negative.  Objective    BP 112/71   Pulse 85   Ht 6' (  1.829 m)   Wt 210 lb (95.3 kg)   SpO2 97%   BMI 28.48 kg/m   Physical Exam Vitals reviewed.  Constitutional:      General: He is not in acute distress.    Appearance: Normal appearance. He is not ill-appearing.  HENT:     Head: Normocephalic and atraumatic.     Right Ear: External ear normal.     Left Ear: External ear normal.     Nose: Nose normal. No congestion or rhinorrhea.     Mouth/Throat:     Mouth: Mucous membranes are moist.     Pharynx: Oropharynx is clear.  Eyes:      General: No scleral icterus.    Extraocular Movements: Extraocular movements intact.     Conjunctiva/sclera: Conjunctivae normal.     Pupils: Pupils are equal, round, and reactive to light.  Cardiovascular:     Rate and Rhythm: Normal rate and regular rhythm.     Pulses: Normal pulses.     Heart sounds: Normal heart sounds. No murmur heard. Pulmonary:     Effort: Pulmonary effort is normal.     Breath sounds: Normal breath sounds. No wheezing, rhonchi or rales.  Abdominal:     General: Abdomen is flat. Bowel sounds are normal. There is no distension.     Palpations: Abdomen is soft.     Tenderness: There is no abdominal tenderness.  Musculoskeletal:        General: No swelling or deformity. Normal range of motion.     Cervical back: Normal range of motion.  Skin:    General: Skin is warm and dry.     Capillary Refill: Capillary refill takes less than 2 seconds.  Neurological:     General: No focal deficit present.     Mental Status: He is alert and oriented to person, place, and time.     Motor: No weakness.  Psychiatric:        Mood and Affect: Mood normal.        Behavior: Behavior normal.        Thought Content: Thought content normal.    Assessment & Plan:   Problem List Items Addressed This Visit       Encounter for general adult medical examination with abnormal findings - Primary    Presenting today to establish care.  Previous records and labs have been reviewed. -Baseline labs ordered today, including one-time HIV/HCV screening -Plan to further address testing outstanding preventative care items at follow-up in 4 weeks       Mixed hyperlipidemia    Lipid panel last updated in September 2023.  Total cholesterol 255 and LDL 192.  He endorses a history of statin intolerance. -Repeat lipid panel ordered today.  I discussed the likely indication for cholesterol-lowering therapy.  He is open to [place]. Will discuss this pending lab results at his follow-up appointment  in 4 weeks.      Current tobacco use    Currently smokes 1 pack/day of cigarettes and has been smoking for at least 30 years.  He is aware of the need to quit, but remains precontemplative with regards to cessation at this time. -The patient was counseled on the dangers of tobacco use, and was advised to quit.  Reviewed strategies to maximize success, including removing cigarettes and smoking materials from environment, stress management, substitution of other forms of reinforcement, support of family/friends, and written materials.       Chest discomfort    He endorses  recent dyspnea on exertion and chest discomfort.  His symptoms began after having COVID-19 last fall have persisted without clear etiology.  He has previously undergone CTA PE protocol that was negative, only remarkable for emphysematous changes.  His exam today is unremarkable. -Given his endorsement of chest discomfort and cardiac risk factors, I have placed a referral to cardiology today -We will follow-up in 4 weeks for reassessment      Return in about 4 weeks (around 12/05/2022).   Johnette Abraham, MD

## 2022-11-07 NOTE — Patient Instructions (Signed)
It was a pleasure to see you today.  Thank you for giving [place] the opportunity to be involved in your care.  Below is a brief recap of your visit and next steps.  We will plan to see you again in 4 weeks.  Summary You have established care today We will check basic labs I have placed a cardiology referral We will plan for follow up in 4 weeks

## 2022-11-08 ENCOUNTER — Other Ambulatory Visit: Payer: Self-pay

## 2022-11-08 ENCOUNTER — Other Ambulatory Visit: Payer: Self-pay | Admitting: Internal Medicine

## 2022-11-08 DIAGNOSIS — E559 Vitamin D deficiency, unspecified: Secondary | ICD-10-CM

## 2022-11-08 LAB — CBC WITH DIFFERENTIAL/PLATELET
Basophils Absolute: 0 10*3/uL (ref 0.0–0.2)
Eos: 2 %
Hematocrit: 44.1 % (ref 37.5–51.0)
Immature Grans (Abs): 0 10*3/uL (ref 0.0–0.1)
Lymphocytes Absolute: 3.4 10*3/uL — ABNORMAL HIGH (ref 0.7–3.1)
Lymphs: 43 %
MCV: 88 fL (ref 79–97)
Monocytes: 8 %
Platelets: 217 10*3/uL (ref 150–450)
RBC: 5.01 x10E6/uL (ref 4.14–5.80)
RDW: 13.2 % (ref 11.6–15.4)
WBC: 8 10*3/uL (ref 3.4–10.8)

## 2022-11-08 LAB — HIV ANTIBODY (ROUTINE TESTING W REFLEX)

## 2022-11-08 LAB — CMP14+EGFR
ALT: 18 IU/L (ref 0–44)
Albumin: 4.6 g/dL (ref 3.8–4.9)
Bilirubin Total: 0.3 mg/dL (ref 0.0–1.2)
CO2: 20 mmol/L (ref 20–29)
Potassium: 3.9 mmol/L (ref 3.5–5.2)
Sodium: 139 mmol/L (ref 134–144)
eGFR: 101 mL/min/{1.73_m2} (ref >59)

## 2022-11-08 LAB — B12 AND FOLATE PANEL
Folate: 14.5 ng/mL (ref >3.0)
Vitamin B-12: 381 pg/mL (ref 232–1245)

## 2022-11-08 LAB — TSH+FREE T4: TSH: 1.96 u[IU]/mL (ref 0.450–4.500)

## 2022-11-08 LAB — IRON,TIBC AND FERRITIN PANEL
Ferritin: 84 ng/mL (ref 30–400)
Iron Saturation: 27 % (ref 15–55)
Iron: 86 ug/dL (ref 38–169)
UIBC: 237 ug/dL (ref 111–343)

## 2022-11-08 LAB — LIPID PANEL
HDL: 37 mg/dL — ABNORMAL LOW (ref >39)
Triglycerides: 253 mg/dL — ABNORMAL HIGH (ref 0–149)

## 2022-11-08 MED ORDER — VITAMIN D (ERGOCALCIFEROL) 1.25 MG (50000 UNIT) PO CAPS: 50000.0000 [IU] | ORAL_CAPSULE | ORAL | 0 refills | Status: DC

## 2022-11-08 MED ORDER — VITAMIN D (ERGOCALCIFEROL) 1.25 MG (50000 UNIT) PO CAPS: 50000.0000 [IU] | ORAL_CAPSULE | ORAL | 0 refills | Status: AC

## 2022-11-09 LAB — HEMOGLOBIN A1C
Est. average glucose Bld gHb Est-mCnc: 114 mg/dL
Hgb A1c MFr Bld: 5.6 % (ref 4.8–5.6)

## 2022-11-09 LAB — LIPID PANEL
Chol/HDL Ratio: 6.4 ratio — ABNORMAL HIGH (ref 0.0–5.0)
Cholesterol, Total: 236 mg/dL — ABNORMAL HIGH (ref 100–199)
LDL Chol Calc (NIH): 152 mg/dL — ABNORMAL HIGH (ref 0–99)
VLDL Cholesterol Cal: 47 mg/dL — ABNORMAL HIGH (ref 5–40)

## 2022-11-09 LAB — CMP14+EGFR
AST: 16 IU/L (ref 0–40)
Albumin/Globulin Ratio: 1.8 (ref 1.2–2.2)
Alkaline Phosphatase: 63 IU/L (ref 44–121)
BUN/Creatinine Ratio: 13 (ref 9–20)
BUN: 12 mg/dL (ref 6–24)
Calcium: 9.5 mg/dL (ref 8.7–10.2)
Chloride: 101 mmol/L (ref 96–106)
Creatinine, Ser: 0.89 mg/dL (ref 0.76–1.27)
Globulin, Total: 2.5 g/dL (ref 1.5–4.5)
Glucose: 81 mg/dL (ref 70–99)
Total Protein: 7.1 g/dL (ref 6.0–8.5)

## 2022-11-09 LAB — CBC WITH DIFFERENTIAL/PLATELET
Basos: 1 %
EOS (ABSOLUTE): 0.1 10*3/uL (ref 0.0–0.4)
Hemoglobin: 15 g/dL (ref 13.0–17.7)
Immature Granulocytes: 0 %
MCH: 29.9 pg (ref 26.6–33.0)
MCHC: 34 g/dL (ref 31.5–35.7)
Monocytes Absolute: 0.6 10*3/uL (ref 0.1–0.9)
Neutrophils Absolute: 3.8 10*3/uL (ref 1.4–7.0)
Neutrophils: 46 %

## 2022-11-09 LAB — IRON,TIBC AND FERRITIN PANEL: Total Iron Binding Capacity: 323 ug/dL (ref 250–450)

## 2022-11-09 LAB — HCV INTERPRETATION

## 2022-11-09 LAB — TSH+FREE T4: Free T4: 1.37 ng/dL (ref 0.82–1.77)

## 2022-11-09 LAB — HCV AB W REFLEX TO QUANT PCR: HCV Ab: NONREACTIVE

## 2022-11-09 LAB — VITAMIN D 25 HYDROXY (VIT D DEFICIENCY, FRACTURES): Vit D, 25-Hydroxy: 17 ng/mL — ABNORMAL LOW (ref 30.0–100.0)

## 2022-11-13 ENCOUNTER — Other Ambulatory Visit: Payer: Self-pay

## 2022-11-13 ENCOUNTER — Encounter: Payer: Self-pay | Admitting: Internal Medicine

## 2022-11-13 ENCOUNTER — Ambulatory Visit (HOSPITAL_COMMUNITY)
Admission: RE | Admit: 2022-11-13 | Discharge: 2022-11-13 | Disposition: A | Discharge status: Home / Self Care | Payer: BC Managed Care – PPO | Source: Ambulatory Visit | Attending: Internal Medicine | Admitting: Internal Medicine

## 2022-11-13 DIAGNOSIS — J690 Pneumonitis due to inhalation of food and vomit: Secondary | ICD-10-CM

## 2022-11-13 DIAGNOSIS — Z720 Tobacco use: Secondary | ICD-10-CM | POA: Insufficient documentation

## 2022-11-13 DIAGNOSIS — E782 Mixed hyperlipidemia: Secondary | ICD-10-CM | POA: Insufficient documentation

## 2022-11-13 DIAGNOSIS — R0789 Other chest pain: Secondary | ICD-10-CM | POA: Insufficient documentation

## 2022-11-13 DIAGNOSIS — Z0001 Encounter for general adult medical examination with abnormal findings: Secondary | ICD-10-CM | POA: Insufficient documentation

## 2022-11-13 NOTE — Assessment & Plan Note (Signed)
Currently smokes 1 pack/day of cigarettes and has been smoking for at least 30 years.  He is aware of the need to quit, but remains precontemplative with regards to cessation at this time. -The patient was counseled on the dangers of tobacco use, and was advised to quit.  Reviewed strategies to maximize success, including removing cigarettes and smoking materials from environment, stress management, substitution of other forms of reinforcement, support of family/friends, and written materials.

## 2022-11-13 NOTE — Assessment & Plan Note (Addendum)
Lipid panel last updated in September 2023.  Total cholesterol 255 and LDL 192.  He endorses a history of statin intolerance. -Repeat lipid panel ordered today.  I discussed the likely indication for cholesterol-lowering therapy.  He is open to [facility] [place]. Will discuss this pending lab results at his follow-up appointment in 4 weeks.

## 2022-11-13 NOTE — Assessment & Plan Note (Signed)
Presenting today to establish care.  Previous records and labs have been reviewed. -Baseline labs ordered today, including one-time HIV/HCV screening -Plan to further address testing outstanding preventative care items at follow-up in 4 weeks

## 2022-11-13 NOTE — Assessment & Plan Note (Signed)
He endorses recent dyspnea on exertion and chest discomfort.  His symptoms began after having COVID-19 last fall have persisted without clear etiology.  He has previously undergone CTA PE protocol that was negative, only remarkable for emphysematous changes.  His exam today is unremarkable. -Given his endorsement of chest discomfort and cardiac risk factors, I have placed a referral to cardiology today -We will follow-up in 4 weeks for reassessment

## 2022-11-27 ENCOUNTER — Encounter: Payer: Self-pay | Admitting: *Deleted

## 2022-11-28 ENCOUNTER — Encounter: Payer: Self-pay | Admitting: Internal Medicine

## 2022-11-28 ENCOUNTER — Ambulatory Visit: Payer: BC Managed Care – PPO | Attending: Internal Medicine | Admitting: Internal Medicine

## 2022-11-28 VITALS — BP 124/88 | HR 67 | Ht 73.0 in | Wt 207.4 lb

## 2022-11-28 DIAGNOSIS — R0789 Other chest pain: Secondary | ICD-10-CM

## 2022-11-28 DIAGNOSIS — R079 Chest pain, unspecified: Secondary | ICD-10-CM | POA: Diagnosis not present

## 2022-11-28 DIAGNOSIS — R0609 Other forms of dyspnea: Secondary | ICD-10-CM

## 2022-11-28 MED ORDER — METOPROLOL TARTRATE 100 MG PO TABS: 100.0000 mg | ORAL_TABLET | Freq: Once | ORAL | 0 refills | Status: DC

## 2022-11-28 NOTE — Patient Instructions (Addendum)
Medication Instructions:  Your physician recommends that you continue on your current medications as directed. Please refer to the Current Medication list given to you today.  Labwork: none  Testing/Procedures: Your physician has requested that you have an echocardiogram. Echocardiography is a painless test that uses sound waves to create images of your heart. It provides your doctor with information about the size and shape of your heart and how well your heart's chambers and valves are working. This procedure takes approximately one hour. There are no restrictions for this procedure. Please do NOT wear cologne, perfume, aftershave, or lotions (deodorant is allowed). Please arrive 15 minutes prior to your appointment time. Coronary CTA-see instructions below  Follow-Up: Your physician recommends that you schedule a follow-up appointment in: as needed  Any Other Special Instructions Will Be Listed Below (If Applicable).  If you need a refill on your cardiac medications before your next appointment, please call your pharmacy.    Your cardiac CT will be scheduled at one of the below locations:   [facility] [address] [place], [place] [address] [phone]  If scheduled at [facility], please arrive at the [facility] and Children's Entrance (Entrance C2) of [facility] 30 minutes prior to test start time. You can use the FREE valet parking offered at entrance C (encouraged to control the heart rate for the test)  Proceed to the [facility] Radiology Department (first floor) to check-in and test prep.  All radiology patients and guests should use entrance C2 at [facility], accessed from [facility], even though the hospital's physical address listed is [address]    Please follow these instructions carefully (unless otherwise directed):  Hold all erectile dysfunction medications at least 3 days (72 hrs) prior to test.  (Ie viagra, cialis, sildenafil, tadalafil, etc) We will administer nitroglycerin during this exam.   On the Night Before the Test: Be sure to Drink plenty of water. Do not consume any caffeinated/decaffeinated beverages or chocolate 12 hours prior to your test. Do not take any antihistamines 12 hours prior to your test. (Hold zyrtec) If the patient has contrast allergy: No contrast allergy  On the Day of the Test: Drink plenty of water until 1 hour prior to the test. Do not eat any food 1 hour prior to test. You may take your regular medications prior to the test except zyrtec Take metoprolol (Lopressor) 100 mg two hours prior to test.     After the Test: Drink plenty of water. After receiving IV contrast, you may experience a mild flushed feeling. This is normal. On occasion, you may experience a mild rash up to 24 hours after the test. This is not dangerous. If this occurs, you can take Benadryl 25 mg and increase your fluid intake. If you experience trouble breathing, this can be serious. If it is severe call 911 IMMEDIATELY. If it is mild, please call our office.  We will call to schedule your test 2-4 weeks out understanding that some insurance companies will need an authorization prior to the service being performed.   For non-scheduling related questions, please contact the cardiac imaging nurse navigator should you have any questions/concerns: Marchia Bond, Cardiac Imaging Nurse Navigator Gordy Clement, Cardiac Imaging Nurse Navigator [facility] Direct Office Dial: [phone]   For scheduling needs, including cancellations and rescheduling, please call [place], [phone].

## 2022-11-29 DIAGNOSIS — R0609 Other forms of dyspnea: Secondary | ICD-10-CM | POA: Insufficient documentation

## 2022-11-29 NOTE — Progress Notes (Signed)
Cardiology Office Note  Date: 11/29/2022   ID: Maddock Brosnahan, DOB 1966-03-20, MRN [id number]  PCP:  Johnette Abraham, MD  Cardiologist:  Chalmers Guest, MD Electrophysiologist:  None   Reason for Office Visit: Evaluation chest pain at the request of Dr. Doren Custard   History of Present Illness: Mark Downs is a 57 y.o. male known to have HLD, tobacco abuse was referred to cardiology clinic for evaluation of chest pain.  Patient was hospitalized in 07/2022 with walking pneumonia and since then, he has been having chest tightness substernally lasting for few seconds, no relation with rest or exercise, resolves spontaneously. Frequency few times per week. He noticed it worsening recently. Associated with SOB. He also noticed that he has to take few breaks at work due to SOB. No palpitations, dizziness, syncope, leg swelling.  He currently smokes cigarettes, cut down from 2 packs to 1 pack/day.  No family history of premature ASCVD.   Past Medical History:  Diagnosis Date   Hyperlipidemia    Tobacco abuse     Past Surgical History:  Procedure Laterality Date   I & D EXTREMITY Right 04/05/2015   Procedure: IRRIGATION AND DEBRIDEMENT EXTREMITY WITH TENDON REPAIR;  Surgeon: Leanora Cover, MD;  Location: [facility];  Service: Orthopedics;  Laterality: Right;   OPEN REDUCTION INTERNAL FIXATION (ORIF) FINGER WITH RADIAL BONE GRAFT Right 05/17/2015   Procedure: RIGHT THUMB METACARPAL BONE GRAFTING FROM DISTAL RADIUS ;  Surgeon: Leanora Cover, MD;  Location: [facility];  Service: Orthopedics;  Laterality: Right;    Current Outpatient Medications  Medication Sig Dispense Refill   Budesonide-Formoterol Fumarate (BREYNA IN) Inhale 2 puffs into the lungs daily.     cetirizine (ZYRTEC) 10 MG tablet Take 10 mg by mouth daily.     fluticasone (FLONASE) 50 MCG/ACT nasal spray Place 1 spray into both nostrils daily.     metoprolol tartrate (LOPRESSOR) 100 MG tablet Take 1 tablet (100 mg  total) by mouth once for 1 dose. 2 hours before your CT 1 tablet 0   Vitamin D, Ergocalciferol, (DRISDOL) 1.25 MG (50000 UNIT) CAPS capsule Take 1 capsule (50,000 Units total) by mouth every 7 (seven) days for 12 doses. 12 capsule 0   No current facility-administered medications for this visit.   Allergies:  Patient has no known allergies.   Social History: The patient  reports that he has been smoking cigarettes. He has been smoking an average of 1 pack per day. He has never used smokeless tobacco. He reports that he does not drink alcohol and does not use drugs.   Family History: The patient's family history is not on file.   ROS:  Please see the history of present illness. Otherwise, complete review of systems is positive for none.  All other systems are reviewed and negative.   Physical Exam: VS:  BP 124/88   Pulse 67   Ht '6\' 1"'$  (1.854 m)   Wt 207 lb 6.4 oz (94.1 kg)   SpO2 96%   BMI 27.36 kg/m , BMI Body mass index is 27.36 kg/m.  Wt Readings from Last 3 Encounters:  11/28/22 207 lb 6.4 oz (94.1 kg)  11/07/22 210 lb (95.3 kg)  07/07/20 205 lb (93 kg)    General: Patient appears comfortable at rest. HEENT: Conjunctiva and lids normal, oropharynx clear with moist mucosa. Neck: Supple, no elevated JVP or carotid bruits, no thyromegaly. Lungs: Clear to auscultation, nonlabored breathing at rest. Cardiac: Regular rate and rhythm, no  S3 or significant systolic murmur, no pericardial rub. Abdomen: Soft, nontender, no hepatomegaly, bowel sounds present, no guarding or rebound. Extremities: No pitting edema, distal pulses 2+. Skin: Warm and dry. Musculoskeletal: No kyphosis. Neuropsychiatric: Alert and oriented x3, affect grossly appropriate.  ECG:  NSR  Recent Labwork: 11/07/2022: ALT 18; AST 16; BUN 12; Creatinine, Ser 0.89; Hemoglobin 15.0; Platelets 217; Potassium 3.9; Sodium 139; TSH 1.960     Component Value Date/Time   CHOL 236 (H) 11/07/2022 1539   TRIG 253 (H)  11/07/2022 1539   HDL 37 (L) 11/07/2022 1539   CHOLHDL 6.4 (H) 11/07/2022 1539   LDLCALC 152 (H) 11/07/2022 1539    Other Studies Reviewed Today:   Assessment and Plan: Patient is a 57 year old M known to have HLD, tobacco abuse was referred to cardiology clinic for evaluation of chest pain.  # Atypical chest pain # DOE -Obtain CTA cardiac -Obtain 2D echocardiogram  # HLD -Follow-up with PCP for HLD management.  # Tobacco abuse -Smoking cessation counseling provided, patient cut down to 1 pack/day from 2 packs/day. He is moderate to cut down and quit at some point. Smoking cessation instruction/counseling given:  counseled patient on the dangers of tobacco use, advised patient to stop smoking, and reviewed strategies to maximize success   I have spent a total of 45 minutes with patient reviewing chart, EKGs, labs and examining patient as well as establishing an assessment and plan that was discussed with the patient.  > 50% of time was spent in direct patient care.      Medication Adjustments/Labs and Tests Ordered: Current medicines are reviewed at length with the patient today.  Concerns regarding medicines are outlined above.   Tests Ordered: Orders Placed This Encounter  Procedures   CT CORONARY MORPH W/CTA COR W/SCORE W/CA W/CM &/OR WO/CM   EKG 12-Lead   ECHOCARDIOGRAM COMPLETE    Medication Changes: Meds ordered this encounter  Medications   metoprolol tartrate (LOPRESSOR) 100 MG tablet    Sig: Take 1 tablet (100 mg total) by mouth once for 1 dose. 2 hours before your CT    Dispense:  1 tablet    Refill:  0    Disposition:  Follow up prn  Signed Sheng Pritz Fidel Levy, MD, 11/29/2022 12:07 PM    [facility] [place], [place], [place] [id number]

## 2022-12-04 ENCOUNTER — Telehealth (HOSPITAL_COMMUNITY): Payer: Self-pay | Admitting: Emergency Medicine

## 2022-12-04 NOTE — Telephone Encounter (Signed)
Reaching out to patient to offer assistance regarding upcoming cardiac imaging study; pt verbalizes understanding of appt date/time, parking situation and where to check in, pre-test NPO status and medications ordered, and verified current allergies; name and call back number provided for further questions should they arise Talea Manges RN Navigator Cardiac Imaging [facility] [phone] office [phone] cell  Arrival 1130 WC entrance Denies iv issues 100mg metoprolol tartrate Aware contrast/nitro  

## 2022-12-05 ENCOUNTER — Ambulatory Visit (HOSPITAL_COMMUNITY)
Admission: RE | Admit: 2022-12-05 | Discharge: 2022-12-05 | Disposition: A | Discharge status: Home / Self Care | Payer: BC Managed Care – PPO | Source: Ambulatory Visit | Attending: Internal Medicine | Admitting: Internal Medicine

## 2022-12-05 DIAGNOSIS — R0789 Other chest pain: Secondary | ICD-10-CM | POA: Insufficient documentation

## 2022-12-05 DIAGNOSIS — R079 Chest pain, unspecified: Secondary | ICD-10-CM | POA: Diagnosis present

## 2022-12-05 MED ORDER — IOHEXOL 350 MG/ML SOLN
100.0000 mL | Freq: Once | INTRAVENOUS | Status: AC | PRN
  Administered 2022-12-05: 100 mL via INTRAVENOUS

## 2022-12-05 MED ORDER — NITROGLYCERIN 0.4 MG SL SUBL
0.8000 mg | SUBLINGUAL_TABLET | Freq: Once | SUBLINGUAL | Status: AC
  Administered 2022-12-05: 0.8 mg via SUBLINGUAL

## 2022-12-05 MED ORDER — NITROGLYCERIN 0.4 MG SL SUBL
SUBLINGUAL_TABLET | SUBLINGUAL | Status: AC
  Filled 2022-12-05: qty 2

## 2022-12-07 ENCOUNTER — Encounter: Payer: Self-pay | Admitting: Internal Medicine

## 2022-12-07 ENCOUNTER — Ambulatory Visit (INDEPENDENT_AMBULATORY_CARE_PROVIDER_SITE_OTHER): Payer: BC Managed Care – PPO | Admitting: Internal Medicine

## 2022-12-07 VITALS — BP 143/92 | HR 62 | Ht 72.0 in | Wt 211.2 lb

## 2022-12-07 DIAGNOSIS — R0789 Other chest pain: Secondary | ICD-10-CM

## 2022-12-07 DIAGNOSIS — E782 Mixed hyperlipidemia: Secondary | ICD-10-CM | POA: Diagnosis not present

## 2022-12-07 DIAGNOSIS — R0609 Other forms of dyspnea: Secondary | ICD-10-CM

## 2022-12-07 DIAGNOSIS — Z5320 Procedure and treatment not carried out because of patient's decision for unspecified reasons: Secondary | ICD-10-CM

## 2022-12-07 DIAGNOSIS — R030 Elevated blood-pressure reading, without diagnosis of hypertension: Secondary | ICD-10-CM | POA: Insufficient documentation

## 2022-12-07 DIAGNOSIS — Z720 Tobacco use: Secondary | ICD-10-CM | POA: Diagnosis not present

## 2022-12-07 NOTE — Assessment & Plan Note (Signed)
He continues to smoke 1 pack/day of cigarettes and has been smoking for at least 30 years.  He is aware of the need to quit and is making strides to gradually reduce the frequency of cigarette use.  He is not interested in any NRT products at this time. -The patient was counseled on the dangers of tobacco use, and was advised to quit.  Reviewed strategies to maximize success, including removing cigarettes and smoking materials from environment, stress management, substitution of other forms of reinforcement, support of family/friends, and written materials.

## 2022-12-07 NOTE — Assessment & Plan Note (Signed)
He continues to experience dyspnea with exertion.  His symptoms are largely unchanged since his previous evaluation.  CT coronary calcium score was 0, suggesting low risk for future cardiac events and making cardiac etiology of his symptoms less likely.  Previous CTA has demonstrated emphysematous changes. -Given his history of tobacco use and emphysematous changes noted on prior CT.  I have ordered PFTs today. -He was prescribed Breyna by his previous PCP.  I have recommended daily use as prescribed. -Further management pending result of PFTs

## 2022-12-07 NOTE — Assessment & Plan Note (Signed)
BP mildly elevated today, 143/92 initially and 143/82 on repeat.  No prior history of hypertension. -No indication to start antihypertensive therapy -We will continue to monitor BP at subsequent appointments

## 2022-12-07 NOTE — Patient Instructions (Signed)
It was a pleasure to see you today.  Thank you for giving [place] the opportunity to be involved in your care.  Below is a brief recap of your visit and next steps.  We will plan to see you again in 6 months.  Summary No medication changes today. We will check PFTs and discuss appropriate steps based on results. Follow up in 6 months

## 2022-12-07 NOTE — Assessment & Plan Note (Signed)
Lipid panel updated last month.  Total cholesterol 236 and LDL 152.  His calculated 10-year ASCVD risk today is 19.9%.  CT coronary calcium score is 0, suggesting low risk for future cardiac events.  He has a history of statin intolerance. -No medication changes today given low risk for future cardiac events based on CT coronary calcium score.  He would prefer to focus on lifestyle changes aimed at improving his cholesterol panel.  Mediterranean diet recommended.

## 2022-12-07 NOTE — Progress Notes (Signed)
Established Patient Office Visit  Subjective   Patient ID: Mark Downs, male    DOB: [birth year]  Age: 57 y.o. MRN: [id number]  Chief Complaint  Patient presents with   Follow-up   Mr. Lamison returns to care today for follow-up.  He was last evaluated by me on 2/14 as a new patient presenting to establish care.  At that time his acute concern was persistent chest discomfort and dyspnea with exertion.  Given his additional risk factors, he was referred to cardiology for further evaluation.  Mr. Schulze has completed an echocardiogram and CT coronary calcium.  His symptoms are largely unchanged.  Mr. Elzey symptoms are largely unchanged today.  He has no additional concerns to discuss.  Past Medical History:  Diagnosis Date   Hyperlipidemia    Tobacco abuse    Past Surgical History:  Procedure Laterality Date   I & D EXTREMITY Right 04/05/2015   Procedure: IRRIGATION AND DEBRIDEMENT EXTREMITY WITH TENDON REPAIR;  Surgeon: Leanora Cover, MD;  Location: [facility];  Service: Orthopedics;  Laterality: Right;   OPEN REDUCTION INTERNAL FIXATION (ORIF) FINGER WITH RADIAL BONE GRAFT Right 05/17/2015   Procedure: RIGHT THUMB METACARPAL BONE GRAFTING FROM DISTAL RADIUS ;  Surgeon: Leanora Cover, MD;  Location: [facility];  Service: Orthopedics;  Laterality: Right;   Social History   Tobacco Use   Smoking status: Every Day    Packs/day: 1    Types: Cigarettes   Smokeless tobacco: Never  Substance Use Topics   Alcohol use: No   Drug use: No   History reviewed. No pertinent family history. No Known Allergies  Review of Systems  Respiratory:  Positive for shortness of breath.   Cardiovascular:  Positive for chest pain.     Objective:     BP (!) 143/92 (BP Location: Right Arm, Patient Position: Sitting, Cuff Size: Normal)   Pulse 62   Ht 6' (1.829 m)   Wt 211 lb 3.2 oz (95.8 kg)   SpO2 97%   BMI 28.64 kg/m  BP Readings from Last 3 Encounters:  12/07/22 (!) 143/92   12/05/22 130/85  11/28/22 124/88   Physical Exam Vitals reviewed.  Constitutional:      General: He is not in acute distress.    Appearance: Normal appearance. He is not ill-appearing.  HENT:     Head: Normocephalic and atraumatic.     Right Ear: External ear normal.     Left Ear: External ear normal.     Nose: Nose normal. No congestion or rhinorrhea.     Mouth/Throat:     Mouth: Mucous membranes are moist.     Pharynx: Oropharynx is clear.  Eyes:     General: No scleral icterus.    Extraocular Movements: Extraocular movements intact.     Conjunctiva/sclera: Conjunctivae normal.     Pupils: Pupils are equal, round, and reactive to light.  Cardiovascular:     Rate and Rhythm: Normal rate and regular rhythm.     Pulses: Normal pulses.     Heart sounds: Normal heart sounds. No murmur heard. Pulmonary:     Effort: Pulmonary effort is normal.     Breath sounds: Normal breath sounds. No wheezing, rhonchi or rales.  Abdominal:     General: Abdomen is flat. Bowel sounds are normal. There is no distension.     Palpations: Abdomen is soft.     Tenderness: There is no abdominal tenderness.  Musculoskeletal:        General: No  swelling or deformity. Normal range of motion.     Cervical back: Normal range of motion.  Skin:    General: Skin is warm and dry.     Capillary Refill: Capillary refill takes less than 2 seconds.  Neurological:     General: No focal deficit present.     Mental Status: He is alert and oriented to person, place, and time.     Motor: No weakness.  Psychiatric:        Mood and Affect: Mood normal.        Behavior: Behavior normal.        Thought Content: Thought content normal.   Last CBC Lab Results  Component Value Date   WBC 8.0 11/07/2022   HGB 15.0 11/07/2022   HCT 44.1 11/07/2022   MCV 88 11/07/2022   MCH 29.9 11/07/2022   RDW 13.2 11/07/2022   PLT 217 [id number]   Last metabolic panel Lab Results  Component Value Date   GLUCOSE 81  11/07/2022   NA 139 11/07/2022   K 3.9 11/07/2022   CL 101 11/07/2022   CO2 20 11/07/2022   BUN 12 11/07/2022   CREATININE 0.89 11/07/2022   EGFR 101 11/07/2022   CALCIUM 9.5 11/07/2022   PROT 7.1 11/07/2022   ALBUMIN 4.6 11/07/2022   LABGLOB 2.5 11/07/2022   AGRATIO 1.8 11/07/2022   BILITOT 0.3 11/07/2022   ALKPHOS 63 11/07/2022   AST 16 11/07/2022   ALT 18 11/07/2022   ANIONGAP 13 07/07/2020   Last lipids Lab Results  Component Value Date   CHOL 236 (H) 11/07/2022   HDL 37 (L) 11/07/2022   LDLCALC 152 (H) 11/07/2022   TRIG 253 (H) 11/07/2022   CHOLHDL 6.4 (H) 11/07/2022   Last hemoglobin A1c Lab Results  Component Value Date   HGBA1C 5.6 11/07/2022   Last thyroid functions Lab Results  Component Value Date   TSH 1.960 11/07/2022   Last vitamin D Lab Results  Component Value Date   VD25OH 17.0 (L) 11/07/2022   Last vitamin B12 and Folate Lab Results  Component Value Date   VITAMINB12 381 11/07/2022   FOLATE 14.5 11/07/2022   The 10-year ASCVD risk score (Arnett DK, et al., 2019) is: 19.9%    Assessment & Plan:   Problem List Items Addressed This Visit       Mixed hyperlipidemia (Chronic)    Lipid panel updated last month.  Total cholesterol 236 and LDL 152.  His calculated 10-year ASCVD risk today is 19.9%.  CT coronary calcium score is 0, suggesting low risk for future cardiac events.  He has a history of statin intolerance. -No medication changes today given low risk for future cardiac events based on CT coronary calcium score.  He would prefer to focus on lifestyle changes aimed at improving his cholesterol panel.  Mediterranean diet recommended.      Current tobacco use - Primary (Chronic)    He continues to smoke 1 pack/day of cigarettes and has been smoking for at least 30 years.  He is aware of the need to quit and is making strides to gradually reduce the frequency of cigarette use.  He is not interested in any NRT products at this time. -The  patient was counseled on the dangers of tobacco use, and was advised to quit.  Reviewed strategies to maximize success, including removing cigarettes and smoking materials from environment, stress management, substitution of other forms of reinforcement, support of family/friends, and written materials.  DOE (dyspnea on exertion) (Chronic)    He continues to experience dyspnea with exertion.  His symptoms are largely unchanged since his previous evaluation.  CT coronary calcium score was 0, suggesting low risk for future cardiac events and making cardiac etiology of his symptoms less likely.  Previous CTA has demonstrated emphysematous changes. -Given his history of tobacco use and emphysematous changes noted on prior CT.  I have ordered PFTs today. -He was prescribed Breyna by his previous PCP.  I have recommended daily use as prescribed. -Further management pending result of PFTs -TTE scheduled for 3/26      Elevated blood pressure reading    BP mildly elevated today, 143/92 initially and 143/82 on repeat.  No prior history of hypertension. -No indication to start antihypertensive therapy -We will continue to monitor BP at subsequent appointments      Return in about 6 months (around 06/09/2023).   Johnette Abraham, MD

## 2022-12-12 ENCOUNTER — Encounter (HOSPITAL_COMMUNITY): Payer: BC Managed Care – PPO

## 2022-12-13 ENCOUNTER — Inpatient Hospital Stay (HOSPITAL_COMMUNITY): Admission: RE | Admit: 2022-12-13 | Payer: BC Managed Care – PPO | Source: Ambulatory Visit

## 2022-12-15 LAB — AMB RESULTS CONSOLE CBG: Glucose: 99

## 2022-12-15 NOTE — Progress Notes (Signed)
Pt advised to invest in BP monitor and blood glucose monitor for maintenance

## 2022-12-17 ENCOUNTER — Encounter: Payer: Self-pay | Admitting: *Deleted

## 2022-12-17 NOTE — Progress Notes (Signed)
Pt seen at 12/15/22 [facility] Equity screening event where b/p was 132/92 and blood sugar was 99. Pt confirmed PCP is Dr. Marland Kitchen (in-basket with event screening results sent to Dr. Doren Custard). Event RN documented she advised pt to monitor his blood pressure by using a b/p cuff to check his own b/p and recording the results, and by reducing his use of tobacco, as well as increasing his exercise. No additional SDOH needs identified at the event. Pt has f/u appt scheduled with cardiology  12/18/22 and pulmonology 12/27/22, as well as future appt with PCP on 06/12/13. No additional health equity team support indicated at this time.

## 2022-12-18 ENCOUNTER — Ambulatory Visit: Payer: BC Managed Care – PPO | Attending: Internal Medicine

## 2022-12-18 DIAGNOSIS — R0609 Other forms of dyspnea: Secondary | ICD-10-CM | POA: Diagnosis not present

## 2022-12-19 LAB — ECHOCARDIOGRAM COMPLETE
AR max vel: 1.78 cm2
AV Area VTI: 1.94 cm2
AV Area mean vel: 1.87 cm2
AV Mean grad: 5 mmHg
AV Peak grad: 8.8 mmHg
Ao pk vel: 1.49 m/s
Area-P 1/2: 2.68 cm2
Calc EF: 64.6 %
S' Lateral: 2.9 cm
Single Plane A2C EF: 59.9 %
Single Plane A4C EF: 64.9 %

## 2022-12-20 ENCOUNTER — Telehealth: Payer: Self-pay | Admitting: Internal Medicine

## 2022-12-20 NOTE — Telephone Encounter (Signed)
Pt would like a callback regarding his results from 12/05/22 and 12/18/22. Pt stated no one has reached out to him and he's concerned. Please advise.

## 2022-12-20 NOTE — Telephone Encounter (Signed)
Results given to patient via results note.

## 2022-12-27 ENCOUNTER — Ambulatory Visit (HOSPITAL_BASED_OUTPATIENT_CLINIC_OR_DEPARTMENT_OTHER): Payer: BC Managed Care – PPO

## 2022-12-27 ENCOUNTER — Encounter (HOSPITAL_BASED_OUTPATIENT_CLINIC_OR_DEPARTMENT_OTHER): Payer: Self-pay

## 2023-01-16 ENCOUNTER — Ambulatory Visit (INDEPENDENT_AMBULATORY_CARE_PROVIDER_SITE_OTHER): Payer: BC Managed Care – PPO | Admitting: Internal Medicine

## 2023-01-16 DIAGNOSIS — Z720 Tobacco use: Secondary | ICD-10-CM | POA: Diagnosis not present

## 2023-01-16 DIAGNOSIS — R0789 Other chest pain: Secondary | ICD-10-CM | POA: Diagnosis not present

## 2023-01-16 LAB — PULMONARY FUNCTION TEST
DL/VA % pred: 89 %
DL/VA: 3.82 ml/min/mmHg/L
DLCO cor % pred: 91 %
DLCO cor: 27.64 ml/min/mmHg
DLCO unc % pred: 91 %
DLCO unc: 27.64 ml/min/mmHg
FEF 25-75 Post: 3.64 L/sec
FEF 25-75 Pre: 2.9 L/sec
FEF2575-%Change-Post: 25 %
FEF2575-%Pred-Post: 108 %
FEF2575-%Pred-Pre: 86 %
FEV1-%Change-Post: 6 %
FEV1-%Pred-Post: 98 %
FEV1-%Pred-Pre: 92 %
FEV1-Post: 3.95 L
FEV1-Pre: 3.7 L
FEV1FVC-%Change-Post: 2 %
FEV1FVC-%Pred-Pre: 95 %
FEV6-%Change-Post: 3 %
FEV6-%Pred-Post: 103 %
FEV6-%Pred-Pre: 99 %
FEV6-Post: 5.18 L
FEV6-Pre: 5.02 L
FEV6FVC-%Change-Post: 0 %
FEV6FVC-%Pred-Post: 103 %
FEV6FVC-%Pred-Pre: 103 %
FVC-%Change-Post: 3 %
FVC-%Pred-Post: 99 %
FVC-%Pred-Pre: 96 %
FVC-Post: 5.23 L
FVC-Pre: 5.04 L
Post FEV1/FVC ratio: 75 %
Post FEV6/FVC ratio: 99 %
Pre FEV1/FVC ratio: 73 %
Pre FEV6/FVC Ratio: 100 %

## 2023-01-16 NOTE — Patient Instructions (Addendum)
Full PFT Performed Today Plethysmography (Lung Volumes)

## 2023-01-16 NOTE — Progress Notes (Addendum)
Full PFT Performed Today minus Plethysmography (Lung Volumes). 

## 2023-02-06 ENCOUNTER — Emergency Department (HOSPITAL_BASED_OUTPATIENT_CLINIC_OR_DEPARTMENT_OTHER): Payer: Managed Care, Other (non HMO)

## 2023-02-06 ENCOUNTER — Emergency Department (HOSPITAL_BASED_OUTPATIENT_CLINIC_OR_DEPARTMENT_OTHER)
Admission: EM | Admit: 2023-02-06 | Discharge: 2023-02-06 | Disposition: A | Discharge status: Home / Self Care | Payer: Managed Care, Other (non HMO) | Attending: Emergency Medicine | Admitting: Emergency Medicine

## 2023-02-06 ENCOUNTER — Encounter (HOSPITAL_BASED_OUTPATIENT_CLINIC_OR_DEPARTMENT_OTHER): Payer: Self-pay | Admitting: Emergency Medicine

## 2023-02-06 ENCOUNTER — Emergency Department (HOSPITAL_BASED_OUTPATIENT_CLINIC_OR_DEPARTMENT_OTHER): Payer: Managed Care, Other (non HMO) | Admitting: Radiology

## 2023-02-06 ENCOUNTER — Other Ambulatory Visit: Payer: Self-pay

## 2023-02-06 DIAGNOSIS — R079 Chest pain, unspecified: Secondary | ICD-10-CM | POA: Diagnosis present

## 2023-02-06 DIAGNOSIS — R0789 Other chest pain: Secondary | ICD-10-CM | POA: Insufficient documentation

## 2023-02-06 NOTE — Discharge Instructions (Signed)
You can alternate Tylenol and ibuprofen for the pain.  Do not take any more than the recommended amount on the bottle.  Follow-up with your primary care doctor if your symptoms are not improving within the next couple of days.  Return to the emergency room if you have any worsening symptoms.

## 2023-02-06 NOTE — ED Provider Notes (Signed)
[place] EMERGENCY DEPARTMENT AT [facility] Provider Note   CSN: [id number] Arrival date & time: 02/06/23  1835     History  Chief Complaint  Patient presents with   Rib Injury    Phoenyx Gallardo is a 57 y.o. male.  Patient is a 57 year old male who presents with pain in his right rib area.  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  He reached up to do some work and heard a crack or pop in his right chest.  He has some pain in his right chest going around to his back.  He denies any nausea or vomiting.  No significant abdominal pain.  No shortness of breath.  He says it hurts when he moves a certain way or coughs or takes a deep breath.  He denies any other injuries.       Home Medications Prior to Admission medications   Medication Sig Start Date End Date Taking? Authorizing Provider  Budesonide-Formoterol Fumarate (BREYNA IN) Inhale 2 puffs into the lungs daily.    [provider]  cetirizine (ZYRTEC) 10 MG tablet Take 10 mg by mouth daily.    [provider]  fluticasone (FLONASE) 50 MCG/ACT nasal spray Place 1 spray into both nostrils daily.    [provider]      Allergies    Patient has no known allergies.    Review of Systems   Review of Systems  Constitutional:  Negative for fever.  Respiratory:  Negative for cough and shortness of breath.   Cardiovascular:  Positive for chest pain.  Gastrointestinal:  Negative for abdominal pain, nausea and vomiting.  Genitourinary:  Negative for dysuria.  Musculoskeletal:  Negative for back pain.  Skin:  Negative for wound.    Physical Exam Updated Vital Signs BP (!) 140/100 (BP Location: Right Arm)   Pulse 65   Temp 97.7 F (36.5 C) (Temporal)   Resp 18   Ht 6' (1.829 m)   Wt 93.4 kg   SpO2 100%   BMI 27.94 kg/m  Physical Exam Constitutional:      Appearance: He is well-developed.  HENT:     Head: Normocephalic and atraumatic.  Eyes:     Pupils:  Pupils are equal, round, and reactive to light.  Cardiovascular:     Rate and Rhythm: Normal rate and regular rhythm.     Heart sounds: Normal heart sounds.  Pulmonary:     Effort: Pulmonary effort is normal. No respiratory distress.     Breath sounds: Normal breath sounds. No wheezing or rales.     Comments: Positive tenderness to the right lower chest wall and around to the upper back.  No crepitus or deformity. Chest:     Chest wall: No tenderness.  Abdominal:     General: Bowel sounds are normal.     Palpations: Abdomen is soft.     Tenderness: There is no abdominal tenderness. There is no guarding or rebound.  Musculoskeletal:        General: Normal range of motion.     Cervical back: Normal range of motion and neck supple.  Lymphadenopathy:     Cervical: No cervical adenopathy.  Skin:    General: Skin is warm and dry.     Findings: No rash.  Neurological:     Mental Status: He is alert and oriented to person, place, and time.     ED Results / Procedures / Treatments   Labs (  all labs ordered are listed, but only abnormal results are displayed) Labs Reviewed - No data to display  EKG None  Radiology CT Chest Wo Contrast  Result Date: 02/06/2023 CLINICAL DATA:  Chest trauma, blunt EXAM: CT CHEST WITHOUT CONTRAST TECHNIQUE: Multidetector CT imaging of the chest was performed following the standard protocol without IV contrast. RADIATION DOSE REDUCTION: This exam was performed according to the departmental dose-optimization program which includes automated exposure control, adjustment of the mA and/or kV according to patient size and/or use of iterative reconstruction technique. COMPARISON:  12/05/2022 FINDINGS: Cardiovascular: Minimal coronary artery calcification. Global cardiac size within normal limits. No pericardial effusion. Central pulmonary arteries are of normal caliber. Minimal atherosclerotic calcification within the thoracic aorta. No aortic aneurysm.  Mediastinum/Nodes: Visualized thyroid is unremarkable. No pathologic thoracic adenopathy. Esophagus unremarkable. Tiny hiatal hernia. Lungs/Pleura: Mild emphysema. Mild bibasilar dependent atelectasis. Stable 4 mm subpleural right lower lobe pulmonary nodule for which no follow-up imaging is recommended. No pneumothorax or pleural effusion. Bronchial wall thickening present in keeping with chronic airway inflammation. Upper Abdomen: No acute abnormality. Musculoskeletal: Degenerative changes are seen within the thoracic spine. No acute bone abnormality. No lytic or blastic bone lesion identified. IMPRESSION: 1. No acute intrathoracic injury. 2. Minimal coronary artery calcification. 3. Mild emphysema. Chronic airway inflammation. 4. Tiny hiatal hernia. 5. Stable 4 mm right lower lobe pulmonary nodule. No follow-up imaging recommended. Aortic Atherosclerosis (ICD10-I70.0) and Emphysema (ICD10-J43.9). Electronically Signed   By: Helyn Numbers M.D.   On: 02/06/2023 20:17   DG Ribs Unilateral W/Chest Right  Result Date: 02/06/2023 CLINICAL DATA:  Possible rib injury EXAM: RIGHT RIBS AND CHEST - 4 VIEW COMPARISON:  No prior rib radiographs, correlation is made with chest radiograph 11/13/2022 FINDINGS: No displaced fracture or other bone lesions are seen involving the ribs. There is no evidence of pneumothorax or pleural effusion. Both lungs are clear. Heart size and mediastinal contours are within normal limits. IMPRESSION: Negative. Electronically Signed   By: Wiliam Ke M.D.   On: 02/06/2023 19:32    Procedures Procedures    Medications Ordered in ED Medications - No data to display  ED Course/ Medical Decision Making/ A&P                             Medical Decision Making Amount and/or Complexity of Data Reviewed Radiology: ordered.   Patient is a 57 year old male who presents with pain to his right rib area after he felt a pop.  He has pain mostly around his right rib cage and right upper  back.  It is reproducible on palpation.  X-ray was performed which is interpreted by me and confirmed by the radiologist to show no evidence of rib fracture or pneumothorax.  CT of his chest was performed which showed no acute abnormalities.  He denies any for any pain medication.  He does not have any spinal tenderness.  Doubt that it is coming from his back.  Suspect that he pulled something in his chest wall.  No specific abdominal tenderness.  The imaging of the upper abdominal area did not show any acute abnormalities.  He was discharged home in good condition.  He will take ibuprofen and Tylenol for symptomatic relief.  Was instructed to follow-up with his primary care doctor if his symptoms are not improving.  Return precautions were given.  Final Clinical Impression(s) / ED Diagnoses Final diagnoses:  Chest wall pain    Rx /  DC Orders ED Discharge Orders     None         Rolan Bucco, MD 02/06/23 2100

## 2023-02-06 NOTE — ED Triage Notes (Signed)
Pt arrives to ED with c/o possible rib injury and abdominal pains. Pt notes that he was laying on top of a pipe when he suddenly heard a "crack" into his right chest wall/RUQ abdomen. He notes pain radiates to his back. He notes movements and cough worsens the pain.

## 2023-02-07 ENCOUNTER — Telehealth: Payer: Self-pay

## 2023-02-07 NOTE — Transitions of Care (Post Inpatient/ED Visit) (Signed)
   02/07/2023  Name: Mark Downs MRN: [id number] DOB: 15-May-1966  Today's TOC FU Call Status: Today's TOC FU Call Status:: Unsuccessul Call (1st Attempt) Unsuccessful Call (1st Attempt) Date: 02/07/23  Attempted to reach the patient regarding the most recent Inpatient/ED visit.  Follow Up Plan: Additional outreach attempts will be made to reach the patient to complete the Transitions of Care (Post Inpatient/ED visit) call.   Signature  Agnes Lawrence, CMA (AAMA)  [facility] [phone]

## 2023-02-21 NOTE — Transitions of Care (Post Inpatient/ED Visit) (Signed)
   02/21/2023  Name: Mark Downs MRN: [id number] DOB: 02-02-1966  Today's TOC FU Call Status: Today's TOC FU Call Status:: Successful TOC FU Call Competed Unsuccessful Call (1st Attempt) Date: 02/07/23 [facility] FU Call Complete Date: 02/21/23  Transition Care Management Follow-up Telephone Call Date of Discharge: 02/06/23 Discharge Facility: [facility] ([facility]) Type of Discharge: Emergency Department Reason for ED Visit: Other: (Chest wall pain) How have you been since you were released from the hospital?: Better Any questions or concerns?: No  Items Reviewed: Did you receive and understand the discharge instructions provided?: Yes Medications obtained,verified, and reconciled?: Yes (Medications Reviewed) Any new allergies since your discharge?: No Dietary orders reviewed?: NA Do you have support at home?: Yes People in Home: spouse  Medications Reviewed Today: Medications Reviewed Today     Reviewed by Leigh Aurora, CMA (Certified Medical Assistant) on 02/21/23 at 1713  Med List Status: <None>   Medication Order Taking? Sig Documenting Provider Last Dose Status Informant  cetirizine (ZYRTEC) 10 MG tablet [id number] No Take 10 mg by mouth daily.  Patient not taking: Reported on 02/21/2023   [provider] Not Taking Active   fluticasone (FLONASE) 50 MCG/ACT nasal spray [id number] No Place 1 spray into both nostrils daily.  Patient not taking: Reported on 02/21/2023   [provider] Not Taking Active             Home Care and Equipment/Supplies: Were Home Health Services Ordered?: NA Any new equipment or medical supplies ordered?: NA  Functional Questionnaire: Do you need assistance with bathing/showering or dressing?: No Do you need assistance with meal preparation?: No Do you need assistance with eating?: No Do you have difficulty maintaining continence: No Do you need assistance with getting out of bed/getting out of a chair/moving?:  No Do you have difficulty managing or taking your medications?: No  Follow up appointments reviewed: PCP Follow-up appointment confirmed?: No (declined) MD Provider Line [phone] Given: Yes Specialist Hospital Follow-up appointment confirmed?: NA Do you need transportation to your follow-up appointment?: No Do you understand care options if your condition(s) worsen?: Yes-patient verbalized understanding    SIGNATURE Agnes Lawrence, CMA (AAMA)  [facility] [phone]

## 2023-05-15 ENCOUNTER — Telehealth: Payer: Self-pay

## 2023-05-15 DIAGNOSIS — M109 Gout, unspecified: Secondary | ICD-10-CM

## 2023-05-15 MED ORDER — PREDNISONE 20 MG PO TABS: 40.0000 mg | ORAL_TABLET | Freq: Every day | ORAL | 0 refills | Status: AC

## 2023-05-15 NOTE — Telephone Encounter (Signed)
Prednisone 40 mg x 5 days prescribed for treatment of gout flare.

## 2023-06-13 ENCOUNTER — Encounter: Payer: Self-pay | Admitting: Internal Medicine

## 2023-06-13 ENCOUNTER — Ambulatory Visit: Payer: Managed Care, Other (non HMO) | Admitting: Internal Medicine

## 2023-06-13 VITALS — BP 135/84 | HR 63 | Ht 73.0 in | Wt 213.2 lb

## 2023-06-13 DIAGNOSIS — E782 Mixed hyperlipidemia: Secondary | ICD-10-CM | POA: Diagnosis not present

## 2023-06-13 DIAGNOSIS — E559 Vitamin D deficiency, unspecified: Secondary | ICD-10-CM | POA: Diagnosis not present

## 2023-06-13 DIAGNOSIS — Z1211 Encounter for screening for malignant neoplasm of colon: Secondary | ICD-10-CM

## 2023-06-13 DIAGNOSIS — Z2821 Immunization not carried out because of patient refusal: Secondary | ICD-10-CM | POA: Diagnosis not present

## 2023-06-13 DIAGNOSIS — Z720 Tobacco use: Secondary | ICD-10-CM

## 2023-06-13 NOTE — Progress Notes (Signed)
Established Patient Office Visit  Subjective   Patient ID: Mark Downs, male    DOB: 1966-01-22  Age: 57 y.o. MRN: [id number]  Chief Complaint  Patient presents with   Follow-up   Mark Downs returns to care today for follow-up.  He was last evaluated by me on 3/15.  No medication changes were made at that time.  PFTs were ordered as he endorsed chronic dyspnea with exertion.  Smoking cessation strongly encouraged.  In the interim, he presented to the emergency department on 5/15 endorsing a right rib injury.  There have otherwise been no acute interval events.  Mark Downs reports feeling fairly well today.  His acute concern is persistent right-sided headaches.  He attributes his current symptoms to allergies and sinus irritation.  He is otherwise asymptomatic and has no acute concerns to discuss.  Past Medical History:  Diagnosis Date   Hyperlipidemia    Tobacco abuse    Past Surgical History:  Procedure Laterality Date   I & D EXTREMITY Right 04/05/2015   Procedure: IRRIGATION AND DEBRIDEMENT EXTREMITY WITH TENDON REPAIR;  Surgeon: Betha Loa, MD;  Location: [facility] OR;  Service: Orthopedics;  Laterality: Right;   OPEN REDUCTION INTERNAL FIXATION (ORIF) FINGER WITH RADIAL BONE GRAFT Right 05/17/2015   Procedure: RIGHT THUMB METACARPAL BONE GRAFTING FROM DISTAL RADIUS ;  Surgeon: Betha Loa, MD;  Location: [facility];  Service: Orthopedics;  Laterality: Right;   Social History   Tobacco Use   Smoking status: Every Day    Current packs/day: 1.00    Average packs/day: 1 pack/day for 36.0 years (36.0 ttl pk-yrs)    Types: Cigarettes    Start date: 06/13/1987   Smokeless tobacco: Never  Substance Use Topics   Alcohol use: No   Drug use: No   History reviewed. No pertinent family history. No Known Allergies  Review of Systems  HENT:  Positive for sinus pain.   Neurological:  Positive for headaches.     Objective:     BP 135/84 (BP Location: Left Arm,  Patient Position: Sitting, Cuff Size: Normal)   Pulse 63   Ht 6\' 1"  (1.854 m)   Wt 213 lb 3.2 oz (96.7 kg)   SpO2 97%   BMI 28.13 kg/m  BP Readings from Last 3 Encounters:  06/13/23 135/84  02/06/23 (!) 128/96  12/15/22 (!) 132/92   Physical Exam Vitals reviewed.  Constitutional:      General: He is not in acute distress.    Appearance: Normal appearance. He is not ill-appearing.  HENT:     Head: Normocephalic and atraumatic.     Right Ear: External ear normal.     Left Ear: External ear normal.     Nose: Nose normal. No congestion or rhinorrhea.     Mouth/Throat:     Mouth: Mucous membranes are moist.     Pharynx: Oropharynx is clear.  Eyes:     General: No scleral icterus.    Extraocular Movements: Extraocular movements intact.     Conjunctiva/sclera: Conjunctivae normal.     Pupils: Pupils are equal, round, and reactive to light.  Cardiovascular:     Rate and Rhythm: Normal rate and regular rhythm.     Pulses: Normal pulses.     Heart sounds: Normal heart sounds. No murmur heard. Pulmonary:     Effort: Pulmonary effort is normal.     Breath sounds: Normal breath sounds. No wheezing, rhonchi or rales.  Abdominal:     General: Abdomen  is flat. Bowel sounds are normal. There is no distension.     Palpations: Abdomen is soft.     Tenderness: There is no abdominal tenderness.  Musculoskeletal:        General: No swelling or deformity. Normal range of motion.     Cervical back: Normal range of motion.  Skin:    General: Skin is warm and dry.     Capillary Refill: Capillary refill takes less than 2 seconds.  Neurological:     General: No focal deficit present.     Mental Status: He is alert and oriented to person, place, and time.     Motor: No weakness.  Psychiatric:        Mood and Affect: Mood normal.        Behavior: Behavior normal.        Thought Content: Thought content normal.   Last CBC Lab Results  Component Value Date   WBC 8.0 11/07/2022   HGB 15.0  11/07/2022   HCT 44.1 11/07/2022   MCV 88 11/07/2022   MCH 29.9 11/07/2022   RDW 13.2 11/07/2022   PLT 217 11/07/2022   Last metabolic panel Lab Results  Component Value Date   GLUCOSE 81 11/07/2022   NA 139 11/07/2022   K 3.9 11/07/2022   CL 101 11/07/2022   CO2 20 11/07/2022   BUN 12 11/07/2022   CREATININE 0.89 11/07/2022   EGFR 101 11/07/2022   CALCIUM 9.5 11/07/2022   PROT 7.1 11/07/2022   ALBUMIN 4.6 11/07/2022   LABGLOB 2.5 11/07/2022   AGRATIO 1.8 11/07/2022   BILITOT 0.3 11/07/2022   ALKPHOS 63 11/07/2022   AST 16 11/07/2022   ALT 18 11/07/2022   ANIONGAP 13 07/07/2020   Last lipids Lab Results  Component Value Date   CHOL 236 (H) 11/07/2022   HDL 37 (L) 11/07/2022   LDLCALC 152 (H) 11/07/2022   TRIG 253 (H) 11/07/2022   CHOLHDL 6.4 (H) 11/07/2022   Last hemoglobin A1c Lab Results  Component Value Date   HGBA1C 5.6 11/07/2022   Last thyroid functions Lab Results  Component Value Date   TSH 1.960 11/07/2022   Last vitamin D Lab Results  Component Value Date   VD25OH 36.6 06/13/2023   Last vitamin B12 and Folate Lab Results  Component Value Date   VITAMINB12 381 11/07/2022   FOLATE 14.5 11/07/2022   The 10-year ASCVD risk score (Arnett DK, et al., 2019) is: 18.2%    Assessment & Plan:   Problem List Items Addressed This Visit       Mixed hyperlipidemia (Chronic)    Lipid panel updated in February.  Total cholesterol 236 and LDL 152.  His 10-year ASCVD risk is 18.2%.  CT coronary calcium score is 0, suggesting low risk for future cardiac events.  He will continue to focus on dietary changes aimed at lowering his cholesterol.  Repeat lipid panel at follow-up in 6 months.      Current tobacco use (Chronic)    He continues to smoke 1 pack/day of cigarettes and remains precontemplative with regards to cessation.  Today we additionally discussed indications for lung cancer screening.  He we will defer making a decision for now and we will  further discuss at follow-up in 6 months.      Vitamin D deficiency - Primary    Noted on previous labs.  He has completed high-dose, weekly vitamin D supplementation.  A repeat vitamin D level has been ordered today.  Colon cancer screening    Guidelines for colon cancer screening were reviewed today.  Screening options, including colonoscopy and Cologuard, were discussed.  He has declined colon cancer screening for now and would like to further discuss at follow-up in 6 months.      Return in about 6 months (around 12/11/2023) for CPE.   Billie Lade, MD

## 2023-06-13 NOTE — Patient Instructions (Signed)
It was a pleasure to see you today.  Thank you for giving [place] the opportunity to be involved in your care.  Below is a brief recap of your visit and next steps.  We will plan to see you again in 6 months.  Summary No medications changes today Repeat vitamin D level Follow up in 6 months for annual exam I recommend quitting smoking as we discussed Will further discuss lung cancer and colon cancer screenings at follow up

## 2023-06-14 LAB — VITAMIN D 25 HYDROXY (VIT D DEFICIENCY, FRACTURES): Vit D, 25-Hydroxy: 36.6 ng/mL (ref 30.0–100.0)

## 2023-06-19 DIAGNOSIS — Z1211 Encounter for screening for malignant neoplasm of colon: Secondary | ICD-10-CM | POA: Insufficient documentation

## 2023-06-19 DIAGNOSIS — E559 Vitamin D deficiency, unspecified: Secondary | ICD-10-CM | POA: Insufficient documentation

## 2023-06-19 NOTE — Assessment & Plan Note (Signed)
Guidelines for colon cancer screening were reviewed today.  Screening options, including colonoscopy and Cologuard, were discussed.  He has declined colon cancer screening for now and would like to further discuss at follow-up in 6 months.

## 2023-06-19 NOTE — Assessment & Plan Note (Addendum)
Lipid panel updated in February.  Total cholesterol 236 and LDL 152.  His 10-year ASCVD risk is 18.2%.  CT coronary calcium score is 0, suggesting low risk for future cardiac events.  He will continue to focus on dietary changes aimed at lowering his cholesterol.  Repeat lipid panel at follow-up in 6 months.

## 2023-06-19 NOTE — Assessment & Plan Note (Signed)
He continues to smoke 1 pack/day of cigarettes and remains precontemplative with regards to cessation.  Today we additionally discussed indications for lung cancer screening.  He we will defer making a decision for now and we will further discuss at follow-up in 6 months.

## 2023-06-19 NOTE — Assessment & Plan Note (Signed)
Noted on previous labs.  He has completed high-dose, weekly vitamin D supplementation.  A repeat vitamin D level has been ordered today.

## 2023-06-25 ENCOUNTER — Encounter: Payer: Self-pay | Admitting: Internal Medicine

## 2023-06-25 DIAGNOSIS — J0100 Acute maxillary sinusitis, unspecified: Secondary | ICD-10-CM

## 2023-06-25 MED ORDER — AMOXICILLIN-POT CLAVULANATE 875-125 MG PO TABS: 1.0000 | ORAL_TABLET | Freq: Two times a day (BID) | ORAL | 0 refills | Status: AC

## 2023-08-08 ENCOUNTER — Telehealth: Payer: Managed Care, Other (non HMO) | Admitting: Physician Assistant

## 2023-08-08 DIAGNOSIS — J019 Acute sinusitis, unspecified: Secondary | ICD-10-CM | POA: Diagnosis not present

## 2023-08-08 DIAGNOSIS — B9689 Other specified bacterial agents as the cause of diseases classified elsewhere: Secondary | ICD-10-CM

## 2023-08-08 MED ORDER — AMOXICILLIN-POT CLAVULANATE 875-125 MG PO TABS: 1.0000 | ORAL_TABLET | Freq: Two times a day (BID) | ORAL | 0 refills | Status: DC

## 2023-08-08 NOTE — Progress Notes (Signed)

## 2023-08-08 NOTE — Progress Notes (Signed)
I have spent 5 minutes in review of e-visit questionnaire, review and updating patient chart, medical decision making and response to patient.   Mia Milan Cody Jacklynn Dehaas, PA-C    

## 2023-08-19 ENCOUNTER — Ambulatory Visit (INDEPENDENT_AMBULATORY_CARE_PROVIDER_SITE_OTHER): Payer: Managed Care, Other (non HMO) | Admitting: Internal Medicine

## 2023-08-19 ENCOUNTER — Ambulatory Visit (HOSPITAL_COMMUNITY)
Admission: RE | Admit: 2023-08-19 | Discharge: 2023-08-19 | Disposition: A | Discharge status: Home / Self Care | Payer: Managed Care, Other (non HMO) | Source: Ambulatory Visit | Attending: Internal Medicine | Admitting: Internal Medicine

## 2023-08-19 ENCOUNTER — Encounter: Payer: Self-pay | Admitting: Internal Medicine

## 2023-08-19 VITALS — BP 117/77 | HR 85 | Wt 213.8 lb

## 2023-08-19 DIAGNOSIS — J209 Acute bronchitis, unspecified: Secondary | ICD-10-CM | POA: Diagnosis present

## 2023-08-19 MED ORDER — AZITHROMYCIN 250 MG PO TABS: ORAL_TABLET | ORAL | 0 refills | Status: DC

## 2023-08-19 MED ORDER — PREDNISONE 20 MG PO TABS: 40.0000 mg | ORAL_TABLET | Freq: Every day | ORAL | 0 refills | Status: AC

## 2023-08-19 NOTE — Assessment & Plan Note (Signed)
Presenting today for an acute visit endorsing symptoms described above.  3-4-week history of chest congestion with productive cough as well as sinus congestion.  Nasal secretions are clear but sputum is cloudy.  Augmentin and leftover amoxicillin did not alleviate his symptoms.  His exam today is unremarkable. -Given persistence of symptoms without significant proved meant, Z-Pak and prednisone 40 mg x 5 days prescribed today.  Will also obtain chest x-ray to evaluate for consolidation.  Recommend as needed use of Mucinex as well.  He was instructed return to care if symptoms worsen or fail to improve.  Otherwise, we will arrange routine follow-up in 4 months.

## 2023-08-19 NOTE — Patient Instructions (Signed)
It was a pleasure to see you today.  Thank you for giving [place] the opportunity to be involved in your care.  Below is a brief recap of your visit and next steps.  We will plan to see you again in March.  Summary Z pak and prescribed prescribed for treatment Chest xray ordered Follow up in March for routine care

## 2023-08-19 NOTE — Progress Notes (Signed)
Acute Office Visit  Subjective:     Patient ID: Mark Downs, male    DOB: 05/17/1966, 57 y.o.   MRN: [id number]  Chief Complaint  Patient presents with   Pneumonia    Patient has been sick for two weeks, he thinks he has walking pneumonia   Mark Downs presents today for an acute visit endorsing a 3-4-week history of persistent chest congestion, productive cough, and sinus congestion.  He was evaluated through e-visit on 11/14 and prescribed Augmentin x 7 days.  He also took leftover amoxicillin previously.  Symptoms have not worsened, but also have not improved.  He specifically endorses coughing fits, mostly occurring at night, productive of cloudy sputum.  He is chest feels tight.  Nasal secretions are clear.  He endorses nausea without vomiting at the end of coughing episodes.  He states that his current symptoms are similar to what he experienced 1 year ago.  Per chart review, he was treated with Augmentin and prednisone for acute bronchitis.  He feels that this was effective in alleviating his symptoms.  Review of Systems  Constitutional:  Positive for malaise/fatigue. Negative for chills and fever.  HENT:  Positive for congestion. Negative for ear pain, sinus pain and sore throat.   Respiratory:  Positive for cough, sputum production and shortness of breath.   Cardiovascular:  Negative for chest pain.      Objective:    BP 117/77 (BP Location: Left Arm, Patient Position: Sitting, Cuff Size: Large)   Pulse 85   Wt 213 lb 12.8 oz (97 kg)   SpO2 94%   BMI 28.21 kg/m  BP Readings from Last 3 Encounters:  08/19/23 117/77  06/13/23 135/84  02/06/23 (!) 128/96   Physical Exam Vitals reviewed.  Constitutional:      General: He is not in acute distress.    Appearance: Normal appearance. He is not ill-appearing.  HENT:     Head: Normocephalic and atraumatic.     Right Ear: External ear normal.     Left Ear: External ear normal.     Nose: Congestion present. No  rhinorrhea.     Mouth/Throat:     Mouth: Mucous membranes are moist.     Pharynx: Oropharynx is clear.  Eyes:     General: No scleral icterus.    Extraocular Movements: Extraocular movements intact.     Conjunctiva/sclera: Conjunctivae normal.     Pupils: Pupils are equal, round, and reactive to light.  Cardiovascular:     Rate and Rhythm: Normal rate and regular rhythm.     Pulses: Normal pulses.     Heart sounds: Normal heart sounds. No murmur heard. Pulmonary:     Effort: Pulmonary effort is normal.     Breath sounds: Normal breath sounds. No wheezing, rhonchi or rales.  Abdominal:     General: Abdomen is flat. Bowel sounds are normal. There is no distension.     Palpations: Abdomen is soft.     Tenderness: There is no abdominal tenderness.  Musculoskeletal:        General: No swelling or deformity. Normal range of motion.     Cervical back: Normal range of motion.  Skin:    General: Skin is warm and dry.     Capillary Refill: Capillary refill takes less than 2 seconds.  Neurological:     General: No focal deficit present.     Mental Status: He is alert and oriented to person, place, and time.     Motor:  No weakness.  Psychiatric:        Mood and Affect: Mood normal.        Behavior: Behavior normal.        Thought Content: Thought content normal.       Assessment & Plan:   Problem List Items Addressed This Visit     Acute bronchitis - Primary    Presenting today for an acute visit endorsing symptoms described above.  3-4-week history of chest congestion with productive cough as well as sinus congestion.  Nasal secretions are clear but sputum is cloudy.  Augmentin and leftover amoxicillin did not alleviate his symptoms.  His exam today is unremarkable. -Given persistence of symptoms without significant proved meant, Z-Pak and prednisone 40 mg x 5 days prescribed today.  Will also obtain chest x-ray to evaluate for consolidation.  Recommend as needed use of Mucinex as  well.  He was instructed return to care if symptoms worsen or fail to improve.  Otherwise, we will arrange routine follow-up in 4 months.       Meds ordered this encounter  Medications   predniSONE (DELTASONE) 20 MG tablet    Sig: Take 2 tablets (40 mg total) by mouth daily for 5 days.    Dispense:  10 tablet    Refill:  0   azithromycin (ZITHROMAX Z-PAK) 250 MG tablet    Sig: Take 2 tablets (500 mg) PO today, then 1 tablet (250 mg) PO daily x4 days.    Dispense:  6 tablet    Refill:  0    Return in about 4 months (around 12/17/2023) for CPE.  Billie Lade, MD

## 2023-09-25 ENCOUNTER — Telehealth: Payer: Managed Care, Other (non HMO) | Admitting: Physician Assistant

## 2023-09-25 DIAGNOSIS — J019 Acute sinusitis, unspecified: Secondary | ICD-10-CM

## 2023-09-25 DIAGNOSIS — B9689 Other specified bacterial agents as the cause of diseases classified elsewhere: Secondary | ICD-10-CM | POA: Diagnosis not present

## 2023-09-25 MED ORDER — AMOXICILLIN-POT CLAVULANATE 875-125 MG PO TABS: 1.0000 | ORAL_TABLET | Freq: Two times a day (BID) | ORAL | 0 refills | Status: DC

## 2023-09-25 NOTE — Progress Notes (Signed)

## 2023-12-05 ENCOUNTER — Telehealth: Admitting: Physician Assistant

## 2023-12-05 DIAGNOSIS — J019 Acute sinusitis, unspecified: Secondary | ICD-10-CM | POA: Diagnosis not present

## 2023-12-05 DIAGNOSIS — B9689 Other specified bacterial agents as the cause of diseases classified elsewhere: Secondary | ICD-10-CM

## 2023-12-05 MED ORDER — AMOXICILLIN-POT CLAVULANATE 875-125 MG PO TABS: 1.0000 | ORAL_TABLET | Freq: Two times a day (BID) | ORAL | 0 refills | Status: DC

## 2023-12-05 NOTE — Progress Notes (Signed)
 I have spent 5 minutes in review of e-visit questionnaire, review and updating patient chart, medical decision making and response to patient.   Piedad Climes, PA-C

## 2023-12-05 NOTE — Progress Notes (Signed)

## 2023-12-18 ENCOUNTER — Ambulatory Visit (INDEPENDENT_AMBULATORY_CARE_PROVIDER_SITE_OTHER): Payer: Managed Care, Other (non HMO) | Admitting: Internal Medicine

## 2023-12-18 ENCOUNTER — Encounter: Payer: Self-pay | Admitting: Internal Medicine

## 2023-12-18 VITALS — BP 123/82 | HR 87 | Ht 73.0 in | Wt 211.2 lb

## 2023-12-18 DIAGNOSIS — Z720 Tobacco use: Secondary | ICD-10-CM | POA: Diagnosis not present

## 2023-12-18 DIAGNOSIS — E559 Vitamin D deficiency, unspecified: Secondary | ICD-10-CM | POA: Diagnosis not present

## 2023-12-18 DIAGNOSIS — Z0001 Encounter for general adult medical examination with abnormal findings: Secondary | ICD-10-CM | POA: Insufficient documentation

## 2023-12-18 DIAGNOSIS — Z8739 Personal history of other diseases of the musculoskeletal system and connective tissue: Secondary | ICD-10-CM | POA: Insufficient documentation

## 2023-12-18 DIAGNOSIS — G8929 Other chronic pain: Secondary | ICD-10-CM

## 2023-12-18 DIAGNOSIS — E782 Mixed hyperlipidemia: Secondary | ICD-10-CM | POA: Diagnosis not present

## 2023-12-18 DIAGNOSIS — Z5320 Procedure and treatment not carried out because of patient's decision for unspecified reasons: Secondary | ICD-10-CM | POA: Insufficient documentation

## 2023-12-18 DIAGNOSIS — M25511 Pain in right shoulder: Secondary | ICD-10-CM | POA: Insufficient documentation

## 2023-12-18 DIAGNOSIS — M25512 Pain in left shoulder: Secondary | ICD-10-CM

## 2023-12-18 DIAGNOSIS — M7712 Lateral epicondylitis, left elbow: Secondary | ICD-10-CM | POA: Insufficient documentation

## 2023-12-18 NOTE — Assessment & Plan Note (Signed)
 Lipid panel updated in February 2024.  Total cholesterol 236 and LDL 152.  He endorses myalgias when previously taking rosuvastatin 20 mg daily.  CT coronary calcium updated in March 2024 with score 0.  He will continue to focus on dietary changes in an effort to lower his cholesterol.  Repeat lipid panel ordered today.

## 2023-12-18 NOTE — Progress Notes (Signed)
 Complete physical exam  Patient: Mark Downs   DOB: 29-Apr-1966   58 y.o. Male  MRN: [id number]  Subjective:    Chief Complaint  Patient presents with   Annual Exam    Mark Downs is a 58 y.o. male who presents today for a complete physical exam. He reports consuming a general diet. The patient has a physically strenuous job, but has no regular exercise apart from work.  He generally feels well. He reports sleeping fairly well. He does not have additional problems to discuss today.    Most recent fall risk assessment:    08/19/2023    3:47 PM  Fall Risk   Falls in the past year? 0  Number falls in past yr: 0  Injury with Fall? 0  Risk for fall due to : No Fall Risks  Follow up Falls evaluation completed     Most recent depression screenings:    12/18/2023    3:53 PM 08/19/2023    3:47 PM  PHQ 2/9 Scores  PHQ - 2 Score 0 0  PHQ- 9 Score 0     Vision:Within last year and Dental: No current dental problems and Receives regular dental care  Past Medical History:  Diagnosis Date   Hyperlipidemia    Tobacco abuse    Past Surgical History:  Procedure Laterality Date   I & D EXTREMITY Right 04/05/2015   Procedure: IRRIGATION AND DEBRIDEMENT EXTREMITY WITH TENDON REPAIR;  Surgeon: Betha Loa, MD;  Location: [facility] OR;  Service: Orthopedics;  Laterality: Right;   OPEN REDUCTION INTERNAL FIXATION (ORIF) FINGER WITH RADIAL BONE GRAFT Right 05/17/2015   Procedure: RIGHT THUMB METACARPAL BONE GRAFTING FROM DISTAL RADIUS ;  Surgeon: Betha Loa, MD;  Location: [facility];  Service: Orthopedics;  Laterality: Right;   Social History   Tobacco Use   Smoking status: Every Day    Current packs/day: 1.00    Average packs/day: 1 pack/day for 36.5 years (36.5 ttl pk-yrs)    Types: Cigarettes    Start date: 06/13/1987   Smokeless tobacco: Never  Substance Use Topics   Alcohol use: No   Drug use: No   History reviewed. No pertinent family history. No Known  Allergies    Patient Care Team: Billie Lade, MD as PCP - General (Internal Medicine) Mallipeddi, Orion Modest, MD as PCP - Cardiology (Cardiology)   Outpatient Medications Prior to Visit  Medication Sig   [DISCONTINUED] amoxicillin-clavulanate (AUGMENTIN) 875-125 MG tablet Take 1 tablet by mouth 2 (two) times daily. (Patient not taking: Reported on 12/18/2023)   No facility-administered medications prior to visit.   Review of Systems  Constitutional:  Negative for chills and fever.  HENT:  Negative for sore throat.   Respiratory:  Negative for cough and shortness of breath.   Cardiovascular:  Negative for chest pain, palpitations and leg swelling.  Gastrointestinal:  Negative for abdominal pain, blood in stool, constipation, diarrhea, nausea and vomiting.  Genitourinary:  Negative for dysuria and hematuria.  Musculoskeletal:  Positive for joint pain (left lateral elbow, bilateral shoulders). Negative for myalgias.  Skin:  Negative for itching and rash.  Neurological:  Negative for dizziness and headaches.  Psychiatric/Behavioral:  Negative for depression and suicidal ideas.        Objective:     BP 123/82 (BP Location: Left Arm, Patient Position: Sitting, Cuff Size: Normal)   Pulse 87   Ht 6\' 1"  (1.854 m)   Wt 211 lb 3.2 oz (95.8 kg)  SpO2 95%   BMI 27.86 kg/m  BP Readings from Last 3 Encounters:  12/18/23 123/82  08/19/23 117/77  06/13/23 135/84   Physical Exam Vitals reviewed.  Constitutional:      General: He is not in acute distress.    Appearance: Normal appearance. He is not ill-appearing.  HENT:     Head: Normocephalic and atraumatic.     Right Ear: External ear normal.     Left Ear: External ear normal.     Nose: Nose normal. No congestion or rhinorrhea.     Mouth/Throat:     Mouth: Mucous membranes are moist.     Pharynx: Oropharynx is clear.  Eyes:     General: No scleral icterus.    Extraocular Movements: Extraocular movements intact.      Conjunctiva/sclera: Conjunctivae normal.     Pupils: Pupils are equal, round, and reactive to light.  Cardiovascular:     Rate and Rhythm: Normal rate and regular rhythm.     Pulses: Normal pulses.     Heart sounds: Normal heart sounds. No murmur heard. Pulmonary:     Effort: Pulmonary effort is normal.     Breath sounds: Normal breath sounds. No wheezing, rhonchi or rales.  Abdominal:     General: Abdomen is flat. Bowel sounds are normal. There is no distension.     Palpations: Abdomen is soft.     Tenderness: There is no abdominal tenderness.  Musculoskeletal:        General: No swelling or deformity. Normal range of motion.     Cervical back: Normal range of motion.     Comments: Pain elicited with resisted extension of the left wrist.   Skin:    General: Skin is warm and dry.     Capillary Refill: Capillary refill takes less than 2 seconds.  Neurological:     General: No focal deficit present.     Mental Status: He is alert and oriented to person, place, and time.     Motor: No weakness.  Psychiatric:        Mood and Affect: Mood normal.        Behavior: Behavior normal.        Thought Content: Thought content normal.   Last CBC Lab Results  Component Value Date   WBC 8.0 11/07/2022   HGB 15.0 11/07/2022   HCT 44.1 11/07/2022   MCV 88 11/07/2022   MCH 29.9 11/07/2022   RDW 13.2 11/07/2022   PLT 217 11/07/2022   Last metabolic panel Lab Results  Component Value Date   GLUCOSE 81 11/07/2022   NA 139 11/07/2022   K 3.9 11/07/2022   CL 101 11/07/2022   CO2 20 11/07/2022   BUN 12 11/07/2022   CREATININE 0.89 11/07/2022   EGFR 101 11/07/2022   CALCIUM 9.5 11/07/2022   PROT 7.1 11/07/2022   ALBUMIN 4.6 11/07/2022   LABGLOB 2.5 11/07/2022   AGRATIO 1.8 11/07/2022   BILITOT 0.3 11/07/2022   ALKPHOS 63 11/07/2022   AST 16 11/07/2022   ALT 18 11/07/2022   ANIONGAP 13 07/07/2020   Last lipids Lab Results  Component Value Date   CHOL 236 (H) 11/07/2022   HDL  37 (L) 11/07/2022   LDLCALC 152 (H) 11/07/2022   TRIG 253 (H) 11/07/2022   CHOLHDL 6.4 (H) 11/07/2022   Last hemoglobin A1c Lab Results  Component Value Date   HGBA1C 5.6 11/07/2022   Last thyroid functions Lab Results  Component Value Date   TSH  1.960 11/07/2022   Last vitamin D Lab Results  Component Value Date   VD25OH 36.6 06/13/2023   Last vitamin B12 and Folate Lab Results  Component Value Date   VITAMINB12 381 11/07/2022   FOLATE 14.5 11/07/2022      Assessment & Plan:    Routine Health Maintenance and Physical Exam  Immunization History  Administered Date(s) Administered   DTP 06/26/1999   Tdap 04/04/2015    Health Maintenance  Topic Date Due   Pneumococcal Vaccine 72-59 Years old (1 of 2 - PCV) Never done   Colonoscopy  Never done   Zoster Vaccines- Shingrix (1 of 2) Never done   COVID-19 Vaccine (1 - 2024-25 season) Never done   INFLUENZA VACCINE  12/23/2023 (Originally 04/25/2023)   Lung Cancer Screening  02/06/2024   DTaP/Tdap/Td (3 - Td or Tdap) 04/03/2025   Hepatitis C Screening  Completed   HIV Screening  Completed   HPV VACCINES  Aged Out    Discussed health benefits of physical activity, and encouraged him to engage in regular exercise appropriate for his age and condition.  Problem List Items Addressed This Visit       Left lateral epicondylitis   He endorses a several week history of left lateral elbow and forearm discomfort.  Pain is elicited with resisted extension of the left wrist.  He endorses difficulty picking up heavy objects due to discomfort.  History and exam findings seem most consistent with left lateral epicondylitis.  Treatment options reviewed.  Recommend wearing a counterforce brace.  Home PT exercises provided.  Okay to use NSAIDs as needed for pain relief.  He will return to care if symptoms worsen or fail to improve.      Mixed hyperlipidemia (Chronic)   Lipid panel updated in February 2024.  Total cholesterol 236 and  LDL 152.  He endorses myalgias when previously taking rosuvastatin 20 mg daily.  CT coronary calcium updated in March 2024 with score 0.  He will continue to focus on dietary changes in an effort to lower his cholesterol.  Repeat lipid panel ordered today.      Current tobacco use (Chronic)   He continues to smoke 1 pack/day of cigarettes and remains precontemplative with regards to cessation.      Colon cancer screening declined   Indications for colon cancer screening were reviewed again today.  Patient is aware screening options and again declines to undergo screening for colon cancer.      Bilateral shoulder pain   R > L.  ROM generally intact bilaterally, however he endorses discomfort with abduction mostly in the right shoulder.  Question rotator cuff tendinopathy vs osteoarthritis.  Treatment options reviewed.  For now, he prefers to attempt home PT.  He will notify our office if he is interested in a referral to orthopedic surgery for further evaluation.      History of gout   He endorses a recent gout flare and that responded quickly to prednisone.  Not currently prescribed any prophylactic medication.  Will check uric acid level today.      Encounter for well adult exam with abnormal findings - Primary   Annual physical completed today.  Previous records and labs reviewed. -Repeat labs ordered today -Outstanding cancer screenings were declined -We will tentatively plan for follow-up in one year.      Return in about 1 year (around 12/17/2024) for CPE.  Billie Lade, MD

## 2023-12-18 NOTE — Assessment & Plan Note (Signed)
 He endorses a recent gout flare and that responded quickly to prednisone.  Not currently prescribed any prophylactic medication.  Will check uric acid level today.

## 2023-12-18 NOTE — Assessment & Plan Note (Signed)
He continues to smoke 1 pack/day of cigarettes and remains precontemplative with regards to cessation.

## 2023-12-18 NOTE — Assessment & Plan Note (Signed)
 Indications for colon cancer screening were reviewed again today.  Patient is aware screening options and again declines to undergo screening for colon cancer.

## 2023-12-18 NOTE — Assessment & Plan Note (Signed)
 He endorses a several week history of left lateral elbow and forearm discomfort.  Pain is elicited with resisted extension of the left wrist.  He endorses difficulty picking up heavy objects due to discomfort.  History and exam findings seem most consistent with left lateral epicondylitis.  Treatment options reviewed.  Recommend wearing a counterforce brace.  Home PT exercises provided.  Okay to use NSAIDs as needed for pain relief.  He will return to care if symptoms worsen or fail to improve.

## 2023-12-18 NOTE — Assessment & Plan Note (Signed)
 Annual physical completed today.  Previous records and labs reviewed. -Repeat labs ordered today -Outstanding cancer screenings were declined -We will tentatively plan for follow-up in one year.

## 2023-12-18 NOTE — Assessment & Plan Note (Signed)
 R > L.  ROM generally intact bilaterally, however he endorses discomfort with abduction mostly in the right shoulder.  Question rotator cuff tendinopathy vs osteoarthritis.  Treatment options reviewed.  For now, he prefers to attempt home PT.  He will notify our office if he is interested in a referral to orthopedic surgery for further evaluation.

## 2023-12-18 NOTE — Patient Instructions (Signed)
 It was a pleasure to see you today.  Thank you for giving [place] the opportunity to be involved in your care.  Below is a brief recap of your visit and next steps.  We will plan to see you again in 1 year.  Summary Annual physical completed today We will update basic labs Please see the attached exercises for your shoulders and elbow I recommend using a counter-force brace for your elbow Follow up in 1 year for annual physical

## 2023-12-19 ENCOUNTER — Encounter: Payer: Self-pay | Admitting: Internal Medicine

## 2023-12-20 LAB — CMP14+EGFR
ALT: 17 IU/L (ref 0–44)
AST: 15 IU/L (ref 0–40)
Albumin: 4.5 g/dL (ref 3.8–4.9)
Alkaline Phosphatase: 70 IU/L (ref 44–121)
BUN/Creatinine Ratio: 12 (ref 9–20)
BUN: 11 mg/dL (ref 6–24)
Bilirubin Total: 0.4 mg/dL (ref 0.0–1.2)
CO2: 22 mmol/L (ref 20–29)
Calcium: 9.1 mg/dL (ref 8.7–10.2)
Chloride: 102 mmol/L (ref 96–106)
Creatinine, Ser: 0.89 mg/dL (ref 0.76–1.27)
Globulin, Total: 2.4 g/dL (ref 1.5–4.5)
Glucose: 95 mg/dL (ref 70–99)
Potassium: 3.9 mmol/L (ref 3.5–5.2)
Sodium: 139 mmol/L (ref 134–144)
Total Protein: 6.9 g/dL (ref 6.0–8.5)
eGFR: 100 mL/min/{1.73_m2} (ref >59)

## 2023-12-20 LAB — CBC WITH DIFFERENTIAL/PLATELET
Basophils Absolute: 0 10*3/uL (ref 0.0–0.2)
Basos: 0 %
EOS (ABSOLUTE): 0.2 10*3/uL (ref 0.0–0.4)
Eos: 2 %
Hematocrit: 43.6 % (ref 37.5–51.0)
Hemoglobin: 14.8 g/dL (ref 13.0–17.7)
Immature Grans (Abs): 0 10*3/uL (ref 0.0–0.1)
Immature Granulocytes: 0 %
Lymphocytes Absolute: 3.1 10*3/uL (ref 0.7–3.1)
Lymphs: 39 %
MCH: 30.4 pg (ref 26.6–33.0)
MCHC: 33.9 g/dL (ref 31.5–35.7)
MCV: 90 fL (ref 79–97)
Monocytes Absolute: 0.6 10*3/uL (ref 0.1–0.9)
Monocytes: 7 %
Neutrophils Absolute: 4 10*3/uL (ref 1.4–7.0)
Neutrophils: 52 %
Platelets: 209 10*3/uL (ref 150–450)
RBC: 4.87 x10E6/uL (ref 4.14–5.80)
RDW: 13.6 % (ref 11.6–15.4)
WBC: 7.8 10*3/uL (ref 3.4–10.8)

## 2023-12-20 LAB — TSH+FREE T4
Free T4: 1.28 ng/dL (ref 0.82–1.77)
TSH: 1.87 u[IU]/mL (ref 0.450–4.500)

## 2023-12-20 LAB — LIPID PANEL
Chol/HDL Ratio: 6.8 ratio — ABNORMAL HIGH (ref 0.0–5.0)
Cholesterol, Total: 251 mg/dL — ABNORMAL HIGH (ref 100–199)
HDL: 37 mg/dL — ABNORMAL LOW (ref >39)
LDL Chol Calc (NIH): 168 mg/dL — ABNORMAL HIGH (ref 0–99)
Triglycerides: 245 mg/dL — ABNORMAL HIGH (ref 0–149)
VLDL Cholesterol Cal: 46 mg/dL — ABNORMAL HIGH (ref 5–40)

## 2023-12-20 LAB — B12 AND FOLATE PANEL
Folate: 12.6 ng/mL (ref >3.0)
Vitamin B-12: 374 pg/mL (ref 232–1245)

## 2023-12-20 LAB — HEMOGLOBIN A1C
Est. average glucose Bld gHb Est-mCnc: 120 mg/dL
Hgb A1c MFr Bld: 5.8 % — ABNORMAL HIGH (ref 4.8–5.6)

## 2023-12-20 LAB — VITAMIN D 25 HYDROXY (VIT D DEFICIENCY, FRACTURES): Vit D, 25-Hydroxy: 23.2 ng/mL — ABNORMAL LOW (ref 30.0–100.0)

## 2023-12-24 LAB — SPECIMEN STATUS REPORT

## 2023-12-24 LAB — URIC ACID: Uric Acid: 7.1 mg/dL (ref 3.8–8.4)
